# Patient Record
Sex: Female | Born: 1970
Health system: Southern US, Community
[De-identification: ages and names within clinical notes are randomized; demographics above are authoritative.]

## PROBLEM LIST (undated history)

## (undated) DIAGNOSIS — M8580 Other specified disorders of bone density and structure, unspecified site: Secondary | ICD-10-CM

## (undated) DIAGNOSIS — D229 Melanocytic nevi, unspecified: Secondary | ICD-10-CM

## (undated) DIAGNOSIS — C4492 Squamous cell carcinoma of skin, unspecified: Secondary | ICD-10-CM

## (undated) HISTORY — PX: BREAST SURGERY: SHX581

## (undated) HISTORY — DX: Other specified disorders of bone density and structure, unspecified site: M85.80

## (undated) HISTORY — PX: DILATION AND CURETTAGE OF UTERUS: SHX78

## (undated) HISTORY — PX: EXTERNAL AUDITORY CANAL RECONSTRUCTION: SHX428

## (undated) HISTORY — PX: WISDOM TOOTH EXTRACTION: SHX21

---
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
## 1997-09-14 ENCOUNTER — Ambulatory Visit (HOSPITAL_COMMUNITY): Admission: RE | Admit: 1997-09-14 | Discharge: 1997-09-14 | Payer: Self-pay | Admitting: Podiatry

## 1997-10-08 ENCOUNTER — Ambulatory Visit (HOSPITAL_COMMUNITY): Admission: RE | Admit: 1997-10-08 | Discharge: 1997-10-08 | Payer: Self-pay | Admitting: Podiatry

## 1997-11-11 ENCOUNTER — Other Ambulatory Visit: Admission: RE | Admit: 1997-11-11 | Discharge: 1997-11-11 | Payer: Self-pay | Admitting: Gynecology

## 1998-12-14 ENCOUNTER — Other Ambulatory Visit: Admission: RE | Admit: 1998-12-14 | Discharge: 1998-12-14 | Payer: Self-pay | Admitting: Gynecology

## 1999-12-06 ENCOUNTER — Other Ambulatory Visit: Admission: RE | Admit: 1999-12-06 | Discharge: 1999-12-06 | Payer: Self-pay | Admitting: Gynecology

## 2000-07-08 DIAGNOSIS — C4492 Squamous cell carcinoma of skin, unspecified: Secondary | ICD-10-CM

## 2000-07-08 HISTORY — DX: Squamous cell carcinoma of skin, unspecified: C44.92

## 2001-05-09 ENCOUNTER — Other Ambulatory Visit: Admission: RE | Admit: 2001-05-09 | Discharge: 2001-05-09 | Payer: Self-pay | Admitting: Obstetrics and Gynecology

## 2001-10-29 DIAGNOSIS — D229 Melanocytic nevi, unspecified: Secondary | ICD-10-CM

## 2001-10-29 HISTORY — DX: Melanocytic nevi, unspecified: D22.9

## 2002-02-19 ENCOUNTER — Ambulatory Visit (HOSPITAL_COMMUNITY): Admission: RE | Admit: 2002-02-19 | Discharge: 2002-02-19 | Payer: Self-pay | Admitting: Obstetrics and Gynecology

## 2002-05-08 ENCOUNTER — Other Ambulatory Visit: Admission: RE | Admit: 2002-05-08 | Discharge: 2002-05-08 | Payer: Self-pay | Admitting: Obstetrics and Gynecology

## 2003-05-10 ENCOUNTER — Other Ambulatory Visit: Admission: RE | Admit: 2003-05-10 | Discharge: 2003-05-10 | Payer: Self-pay | Admitting: Obstetrics and Gynecology

## 2004-01-21 ENCOUNTER — Ambulatory Visit (HOSPITAL_COMMUNITY): Admission: RE | Admit: 2004-01-21 | Discharge: 2004-01-21 | Payer: Self-pay | Admitting: Internal Medicine

## 2004-01-31 ENCOUNTER — Ambulatory Visit (HOSPITAL_COMMUNITY): Admission: RE | Admit: 2004-01-31 | Discharge: 2004-01-31 | Payer: Self-pay | Admitting: Internal Medicine

## 2004-05-10 ENCOUNTER — Other Ambulatory Visit: Admission: RE | Admit: 2004-05-10 | Discharge: 2004-05-10 | Payer: Self-pay | Admitting: Obstetrics and Gynecology

## 2005-06-06 ENCOUNTER — Other Ambulatory Visit: Admission: RE | Admit: 2005-06-06 | Discharge: 2005-06-06 | Payer: Self-pay | Admitting: Obstetrics and Gynecology

## 2011-11-28 ENCOUNTER — Other Ambulatory Visit: Payer: Self-pay | Admitting: Obstetrics and Gynecology

## 2011-11-28 DIAGNOSIS — Z1231 Encounter for screening mammogram for malignant neoplasm of breast: Secondary | ICD-10-CM

## 2011-12-06 ENCOUNTER — Ambulatory Visit: Payer: Self-pay

## 2013-05-25 ENCOUNTER — Ambulatory Visit (INDEPENDENT_AMBULATORY_CARE_PROVIDER_SITE_OTHER): Payer: BC Managed Care – PPO | Admitting: Podiatry

## 2013-05-25 ENCOUNTER — Encounter: Payer: Self-pay | Admitting: Podiatry

## 2013-05-25 ENCOUNTER — Telehealth: Payer: Self-pay | Admitting: *Deleted

## 2013-05-25 VITALS — BP 108/73 | HR 93 | Resp 20 | Ht 68.0 in | Wt 127.0 lb

## 2013-05-25 DIAGNOSIS — B351 Tinea unguium: Secondary | ICD-10-CM

## 2013-05-25 NOTE — Telephone Encounter (Signed)
Maria Hayes [phone] r 1st toenail fragment sent for diagnosis of fungal.

## 2013-05-25 NOTE — Progress Notes (Signed)
   Subjective:    Patient ID: Maria Hayes, female    DOB: [birth year], 43 y.o.   MRN: [id number]                       "I'm still having a problem with the nail it's not getting any better." HPI Comments:       N  Discolored, loose from skin, irritating L  Fungal Nail Hallux Rt D  4yrs O  Slowly  C  About the same A  None  T  OTC medicine   This patient presents again concern about the deformity in the distal aspect of the right hallux nail. A PAS and fungal culture was obtained on the initial visit of 04/25/2012. [facility] Accession [id number] demonstrated a negative PAS stain and a negative fungal culture final date 05/27/2012   Review of Systems  HENT: Positive for sore throat.   Respiratory: Positive for cough.   All other systems reviewed and are negative.        Objective:   Physical Exam Orientated x20 43 year old white female  Dermatological: The distal aspect of the right hallux nail is partially detached from the underlying nailbed with texture and color changes. The proximal nail plate is normal texture.       Assessment & Plan:   Assessment: Traumatic nail changes versus onychomycoses  Plan: Patient was advised that previous fungal culture was negative. I offered her repeat culture. She consents to this. The distal right hallux nail was debrided and the nail fragments submitted to [facility] lab for PAS stain and fungal culture. Notify patient on receipt of culture. Patient was advised that if the repeat culture was negative would assume that the nail pathology was not related to a fungal infection.

## 2013-05-28 ENCOUNTER — Other Ambulatory Visit (HOSPITAL_COMMUNITY): Payer: Self-pay | Admitting: Internal Medicine

## 2013-05-28 DIAGNOSIS — M810 Age-related osteoporosis without current pathological fracture: Secondary | ICD-10-CM

## 2013-06-02 ENCOUNTER — Other Ambulatory Visit: Payer: Self-pay | Admitting: Dermatology

## 2013-06-03 ENCOUNTER — Ambulatory Visit (HOSPITAL_COMMUNITY)
Admission: RE | Admit: 2013-06-03 | Discharge: 2013-06-03 | Disposition: A | Discharge status: Home / Self Care | Payer: BC Managed Care – PPO | Source: Ambulatory Visit | Attending: Internal Medicine | Admitting: Internal Medicine

## 2013-06-03 DIAGNOSIS — M810 Age-related osteoporosis without current pathological fracture: Secondary | ICD-10-CM

## 2013-06-03 DIAGNOSIS — Z13820 Encounter for screening for osteoporosis: Secondary | ICD-10-CM | POA: Insufficient documentation

## 2013-06-03 DIAGNOSIS — Z780 Asymptomatic menopausal state: Secondary | ICD-10-CM | POA: Insufficient documentation

## 2013-07-06 ENCOUNTER — Encounter: Payer: Self-pay | Admitting: Podiatry

## 2013-07-20 ENCOUNTER — Telehealth: Payer: Self-pay | Admitting: *Deleted

## 2013-07-20 NOTE — Telephone Encounter (Signed)
Pt request fungal culture results.

## 2013-07-21 ENCOUNTER — Encounter: Payer: Self-pay | Admitting: Podiatry

## 2013-07-22 NOTE — Telephone Encounter (Signed)
Left message [phone] that the fungal results may not be in for 4 to 6 weeks and callback would be after the doctor reviewed.

## 2013-07-24 ENCOUNTER — Telehealth: Payer: Self-pay | Admitting: *Deleted

## 2013-07-24 DIAGNOSIS — Z79899 Other long term (current) drug therapy: Secondary | ICD-10-CM

## 2013-07-24 NOTE — Telephone Encounter (Addendum)
Informed pt I called for the culture results and I would present to Dr Amalia Hailey on MOnday.  Dr Amalia Hailey states pt's fungal results show Sporanox is the medication of choice.  I informed pt of the therapy process and lab work.  Labs ordered electronically.  Mailed to pt's home - lab orders and map to lab.

## 2013-07-29 ENCOUNTER — Encounter: Payer: Self-pay | Admitting: Podiatry

## 2013-07-29 ENCOUNTER — Other Ambulatory Visit: Payer: Self-pay | Admitting: Podiatry

## 2013-08-05 LAB — CBC WITH DIFFERENTIAL/PLATELET
Basophils Absolute: 0 10*3/uL (ref 0.0–0.1)
Basophils Relative: 0 % (ref 0–1)
Eosinophils Absolute: 0.1 10*3/uL (ref 0.0–0.7)
Eosinophils Relative: 1 % (ref 0–5)
HCT: 40.3 % (ref 36.0–46.0)
Hemoglobin: 13.7 g/dL (ref 12.0–15.0)
Lymphocytes Relative: 22 % (ref 12–46)
Lymphs Abs: 1.3 10*3/uL (ref 0.7–4.0)
MCH: 30.3 pg (ref 26.0–34.0)
MCHC: 34 g/dL (ref 30.0–36.0)
MCV: 89.2 fL (ref 78.0–100.0)
Monocytes Absolute: 0.3 10*3/uL (ref 0.1–1.0)
Monocytes Relative: 6 % (ref 3–12)
Neutro Abs: 4.1 10*3/uL (ref 1.7–7.7)
Neutrophils Relative %: 71 % (ref 43–77)
Platelets: 154 10*3/uL (ref 150–400)
RBC: 4.52 MIL/uL (ref 3.87–5.11)
RDW: 13.1 % (ref 11.5–15.5)
WBC: 5.8 10*3/uL (ref 4.0–10.5)

## 2013-08-06 LAB — HEPATIC FUNCTION PANEL
ALT: 23 U/L (ref 0–35)
AST: 16 U/L (ref 0–37)
Albumin: 4.2 g/dL (ref 3.5–5.2)
Alkaline Phosphatase: 47 U/L (ref 39–117)
Bilirubin, Direct: 0.1 mg/dL (ref 0.0–0.3)
Indirect Bilirubin: 0.5 mg/dL (ref 0.2–1.2)
Total Bilirubin: 0.6 mg/dL (ref 0.2–1.2)
Total Protein: 6.5 g/dL (ref 6.0–8.3)

## 2013-08-12 ENCOUNTER — Other Ambulatory Visit: Payer: Self-pay | Admitting: *Deleted

## 2013-08-12 MED ORDER — ITRACONAZOLE 100 MG PO CAPS: ORAL_CAPSULE | ORAL | Status: DC

## 2013-08-12 NOTE — Telephone Encounter (Signed)
Per Dr. Amalia Hailey, I called and informed the patient that her blood work was normal.  We e-scribed a prescription for Sporanox to [facility].

## 2013-08-13 ENCOUNTER — Telehealth: Payer: Self-pay | Admitting: *Deleted

## 2013-08-13 NOTE — Telephone Encounter (Signed)
Message copied by Lolita Rieger on Thu Aug 13, 2013  9:48 AM ------      Message from: Harriett Sine D      Created: Wed Aug 12, 2013  5:15 PM       Atley Neubert, please call results.  Marcy Siren      ----- Message -----         From: Kendell Bane, DPM         Sent: 08/12/2013   8:30 AM           To: Andres Ege, RN            Lab is within normal limits.: Sporanox 200 mg by mouth twice a day x7 days. Off 3 weeks.       ------

## 2013-08-13 NOTE — Telephone Encounter (Signed)
I called to make sure patient had gotten her prescription.  She said she was going to pick it up today or tomorrow, plans on starting it over the weekend.  I informed her that her blood work was normal.

## 2013-08-27 ENCOUNTER — Telehealth: Payer: Self-pay | Admitting: *Deleted

## 2013-08-27 NOTE — Telephone Encounter (Signed)
I left a message last week because my pharmacy would not fill my prescription.  They said it requires prior authorization.  Calling to check on the status.  Please call me back.  I returned her call and informed her we are waiting on response from her insurance company.  We had to send them culture results and chart notes.  She asked how long it would take.  I told her I really can't say.  She asked that she be called once we find out if it has been approved or not.  I told her we will.

## 2013-09-03 ENCOUNTER — Telehealth: Payer: Self-pay | Admitting: *Deleted

## 2013-09-03 NOTE — Telephone Encounter (Signed)
I called to inform the patient that her insurance company denied coverage of her Itraconazole.  She asked for the reason.  I told her they said that the nail involvement does not causes significant debilitation or secondary infection despited appropriate conservative management.  She asked what can she do.  I told her she can appeal it.  She asked me to send her a copy of the denial.  I'm going to mail the information to her.

## 2013-11-19 ENCOUNTER — Other Ambulatory Visit: Payer: Self-pay | Admitting: Obstetrics and Gynecology

## 2013-11-19 DIAGNOSIS — N63 Unspecified lump in unspecified breast: Secondary | ICD-10-CM

## 2013-11-30 ENCOUNTER — Ambulatory Visit
Admission: RE | Admit: 2013-11-30 | Discharge: 2013-11-30 | Disposition: A | Discharge status: Home / Self Care | Payer: BC Managed Care – PPO | Source: Ambulatory Visit | Attending: Obstetrics and Gynecology | Admitting: Obstetrics and Gynecology

## 2013-11-30 DIAGNOSIS — N63 Unspecified lump in unspecified breast: Secondary | ICD-10-CM

## 2015-04-05 ENCOUNTER — Other Ambulatory Visit: Payer: Self-pay

## 2015-04-05 DIAGNOSIS — Z1231 Encounter for screening mammogram for malignant neoplasm of breast: Secondary | ICD-10-CM

## 2015-04-22 ENCOUNTER — Ambulatory Visit
Admission: RE | Admit: 2015-04-22 | Discharge: 2015-04-22 | Disposition: A | Discharge status: Home / Self Care | Payer: BLUE CROSS/BLUE SHIELD | Source: Ambulatory Visit

## 2015-04-22 DIAGNOSIS — Z1231 Encounter for screening mammogram for malignant neoplasm of breast: Secondary | ICD-10-CM

## 2015-06-21 ENCOUNTER — Other Ambulatory Visit: Payer: Self-pay | Admitting: Dermatology

## 2015-06-21 DIAGNOSIS — C439 Malignant melanoma of skin, unspecified: Secondary | ICD-10-CM

## 2015-06-21 HISTORY — DX: Malignant melanoma of skin, unspecified: C43.9

## 2015-07-08 ENCOUNTER — Other Ambulatory Visit: Payer: Self-pay | Admitting: Dermatology

## 2016-05-08 ENCOUNTER — Ambulatory Visit (INDEPENDENT_AMBULATORY_CARE_PROVIDER_SITE_OTHER): Payer: BLUE CROSS/BLUE SHIELD | Admitting: Physician Assistant

## 2016-05-08 ENCOUNTER — Ambulatory Visit (INDEPENDENT_AMBULATORY_CARE_PROVIDER_SITE_OTHER): Payer: BLUE CROSS/BLUE SHIELD

## 2016-05-08 VITALS — BP 102/66 | HR 84 | Temp 98.3°F | Resp 18 | Ht 68.0 in | Wt 126.0 lb

## 2016-05-08 DIAGNOSIS — J40 Bronchitis, not specified as acute or chronic: Secondary | ICD-10-CM

## 2016-05-08 DIAGNOSIS — R059 Cough, unspecified: Secondary | ICD-10-CM

## 2016-05-08 DIAGNOSIS — R05 Cough: Secondary | ICD-10-CM | POA: Diagnosis not present

## 2016-05-08 MED ORDER — PROMETHAZINE-DM 6.25-15 MG/5ML PO SYRP: 2.5000 mL | ORAL_SOLUTION | Freq: Four times a day (QID) | ORAL | 0 refills | Status: DC | PRN

## 2016-05-08 MED ORDER — DOXYCYCLINE HYCLATE 100 MG PO TABS: 100.0000 mg | ORAL_TABLET | Freq: Two times a day (BID) | ORAL | 0 refills | Status: DC

## 2016-05-08 MED FILL — PROMETHAZINE-DM SYRUP: 6.25-15 | 6 days supply | Qty: 118 | Fill #0

## 2016-05-08 MED FILL — DOXYCYCLINE HYCLATE 100 MG: 100 | 10 days supply | Qty: 20 | Fill #0

## 2016-05-08 NOTE — Patient Instructions (Addendum)
  Please push fluids.  Tylenol and Motrin for fever and body aches.    A humidifier can help especially when the air is dry -if you do not have a humidifier you can boil a pot of water on the stove in your home to help with the dry air.  Nasal saline spray can be helpful to keep the mucus membranes moist and thin the nasal mucus    IF you received an x-ray today, you will receive an invoice from [facility]. Please contact [facility] at [phone] with questions or concerns regarding your invoice.   IF you received labwork today, you will receive an invoice from [facility]. Please contact [facility] at [phone] with questions or concerns regarding your invoice.   Our billing staff will not be able to assist you with questions regarding bills from these companies.  You will be contacted with the lab results as soon as they are available. The fastest way to get your results is to activate your My Chart account. Instructions are located on the last page of this paperwork. If you have not heard from us regarding the results in 2 weeks, please contact this office.      

## 2016-05-08 NOTE — Progress Notes (Signed)
Maria Hayes  MRN: [id number] DOB: 06/04/1970  Subjective:  Pt presents to clinic with 2 week h/o cold symptoms.  Started with congestion and then last week she started having a cough which she thought was improving until this am when she sneezing she started having more chest tenderness on the right side - the pain is not reproducible but she does have more pain with movement and slight pain with deep breathing.  She has been using Vicks vapor rub on her feet and that has helped her cough but it is worse at night.  The cough was productive last week with green thick sputum, this week it is minimal sputum production. She is having no SOB or wheezing.  Hycodan - made her nauseated so she stopped that mucinex - DM gave her a HA  Review of Systems  Constitutional: Negative for chills and fever.  HENT: Negative for congestion, sinus pressure and sore throat.   Respiratory: Positive for cough (green) and chest tightness. Negative for shortness of breath and wheezing.        No h/o asthma, nonsmoker  Gastrointestinal: Negative.   Neurological: Positive for headaches.  Psychiatric/Behavioral: Negative for sleep disturbance.    There are no active problems to display for this patient.   No current outpatient prescriptions on file prior to visit.   No current facility-administered medications on file prior to visit.     No Known Allergies  Pt patients past, family and social history were reviewed and updated.   Objective:  BP 102/66 (BP Location: Right Arm, Patient Position: Sitting, Cuff Size: Small)   Pulse 84   Temp 98.3 F (36.8 C) (Oral)   Resp 18   Ht 5\' 8"  (1.727 m)   Wt 126 lb (57.2 kg)   LMP  (LMP Unknown)   SpO2 100%   BMI 19.16 kg/m   Physical Exam  Constitutional: She is oriented to person, place, and time and well-developed, well-nourished, and in no distress.  HENT:  Head: Normocephalic and atraumatic.  Right Ear: Hearing, tympanic membrane, external ear  and ear canal normal.  Left Ear: Hearing, tympanic membrane, external ear and ear canal normal.  Nose: Nose normal.  Mouth/Throat: Uvula is midline, oropharynx is clear and moist and mucous membranes are normal.  Eyes: Conjunctivae are normal.  Neck: Normal range of motion.  Cardiovascular: Normal rate, regular rhythm and normal heart sounds.   No murmur heard. Pulmonary/Chest: Effort normal and breath sounds normal. She has no wheezes.  Expiratory phase is longer than inspiratory phase  Neurological: She is alert and oriented to person, place, and time. Gait normal.  Skin: Skin is warm and dry.  Psychiatric: Mood, memory, affect and judgment normal.  Vitals reviewed.  Dg Chest 2 View  Result Date: 05/08/2016 CLINICAL DATA:  Cough and right-sided chest pain for the past 2 days. Nonsmoker. EXAM: CHEST  2 VIEW COMPARISON:  None in PACs FINDINGS: The lungs are mildly hyperinflated. There is no focal infiltrate. There is no pneumothorax, pneumomediastinum, or pleural effusion. The heart and pulmonary vascularity are normal. The mediastinum is normal in width. There is no pleural effusion. IMPRESSION: Hyperinflation which may be voluntary or may reflect reactive airway disease/acute bronchitis. There is no pneumonia nor other acute cardiopulmonary abnormality. Electronically Signed   By: David  Martinique M.D.   On: 05/08/2016 11:36    Assessment and Plan :  Cough - Plan: DG Chest 2 View  Bronchitis - Plan: doxycycline (VIBRA-TABS) 100 MG tablet, promethazine-dextromethorphan (  PROMETHAZINE-DM) 6.25-15 MG/5ML syrup - treat for atypical bacteria - cough medication as needed - there is no wheezing on exam but she will let me know if she starts and I will send her in an albuterol inhaler at that time.  Increase fluids with help get mucus thin since mucinex gave her a HA.  Windell Hummingbird PA-C  [facility] 05/08/2016 12:21 PM

## 2016-05-09 ENCOUNTER — Telehealth: Payer: Self-pay

## 2016-05-09 NOTE — Telephone Encounter (Signed)
Pt was seen 12/19 by Maria Hayes she was given antibiotic and she now has a fever and she didn't know if that needed to be addressed and her work will also require a note for when she goes back to work   She would need it for the rest of the week since she has a fever she said it could be faxed to her office [phone]

## 2016-05-10 NOTE — Telephone Encounter (Signed)
Pt saw her pcp today and got all this addressed, she is now on tamiflu

## 2016-05-18 ENCOUNTER — Ambulatory Visit (HOSPITAL_COMMUNITY)
Admission: RE | Admit: 2016-05-18 | Discharge: 2016-05-18 | Disposition: A | Discharge status: Home / Self Care | Payer: BLUE CROSS/BLUE SHIELD | Source: Ambulatory Visit | Attending: Internal Medicine | Admitting: Internal Medicine

## 2016-05-18 ENCOUNTER — Other Ambulatory Visit (HOSPITAL_COMMUNITY): Payer: Self-pay | Admitting: Internal Medicine

## 2016-05-18 DIAGNOSIS — R079 Chest pain, unspecified: Secondary | ICD-10-CM

## 2016-05-18 DIAGNOSIS — R05 Cough: Secondary | ICD-10-CM | POA: Insufficient documentation

## 2016-05-18 DIAGNOSIS — R059 Cough, unspecified: Secondary | ICD-10-CM

## 2016-06-12 ENCOUNTER — Encounter (HOSPITAL_COMMUNITY): Payer: BLUE CROSS/BLUE SHIELD | Attending: Hematology & Oncology | Admitting: Hematology & Oncology

## 2016-06-12 ENCOUNTER — Encounter (HOSPITAL_COMMUNITY): Payer: Self-pay | Admitting: Hematology & Oncology

## 2016-06-12 VITALS — BP 113/71 | HR 75 | Temp 98.1°F | Resp 16 | Ht 68.0 in | Wt 126.2 lb

## 2016-06-12 DIAGNOSIS — Z803 Family history of malignant neoplasm of breast: Secondary | ICD-10-CM | POA: Diagnosis not present

## 2016-06-12 DIAGNOSIS — Z801 Family history of malignant neoplasm of trachea, bronchus and lung: Secondary | ICD-10-CM | POA: Insufficient documentation

## 2016-06-12 DIAGNOSIS — D0372 Melanoma in situ of left lower limb, including hip: Secondary | ICD-10-CM

## 2016-06-12 DIAGNOSIS — Z9889 Other specified postprocedural states: Secondary | ICD-10-CM | POA: Insufficient documentation

## 2016-06-12 DIAGNOSIS — D72819 Decreased white blood cell count, unspecified: Secondary | ICD-10-CM

## 2016-06-12 DIAGNOSIS — D696 Thrombocytopenia, unspecified: Secondary | ICD-10-CM | POA: Diagnosis not present

## 2016-06-12 LAB — SEDIMENTATION RATE: Sed Rate: 4 mm/hr (ref 0–22)

## 2016-06-12 LAB — CBC WITH DIFFERENTIAL/PLATELET
Basophils Absolute: 0 10*3/uL (ref 0.0–0.1)
Basophils Relative: 1 %
Eosinophils Absolute: 0.1 10*3/uL (ref 0.0–0.7)
Eosinophils Relative: 2 %
HCT: 39.6 % (ref 36.0–46.0)
Hemoglobin: 13.6 g/dL (ref 12.0–15.0)
Lymphocytes Relative: 34 %
Lymphs Abs: 1.1 10*3/uL (ref 0.7–4.0)
MCH: 30.5 pg (ref 26.0–34.0)
MCHC: 34.3 g/dL (ref 30.0–36.0)
MCV: 88.8 fL (ref 78.0–100.0)
Monocytes Absolute: 0.3 10*3/uL (ref 0.1–1.0)
Monocytes Relative: 10 %
Neutro Abs: 1.7 10*3/uL (ref 1.7–7.7)
Neutrophils Relative %: 53 %
Platelets: 138 10*3/uL — ABNORMAL LOW (ref 150–400)
RBC: 4.46 MIL/uL (ref 3.87–5.11)
RDW: 12.9 % (ref 11.5–15.5)
WBC: 3.3 10*3/uL — ABNORMAL LOW (ref 4.0–10.5)

## 2016-06-12 LAB — COMPREHENSIVE METABOLIC PANEL
ALT: 24 U/L (ref 14–54)
AST: 24 U/L (ref 15–41)
Albumin: 5.1 g/dL — ABNORMAL HIGH (ref 3.5–5.0)
Alkaline Phosphatase: 71 U/L (ref 38–126)
Anion gap: 7 (ref 5–15)
BUN: 14 mg/dL (ref 6–20)
CO2: 30 mmol/L (ref 22–32)
Calcium: 10.2 mg/dL (ref 8.9–10.3)
Chloride: 99 mmol/L — ABNORMAL LOW (ref 101–111)
Creatinine, Ser: 0.9 mg/dL (ref 0.44–1.00)
GFR calc Af Amer: >60 mL/min (ref >60)
GFR calc non Af Amer: >60 mL/min (ref >60)
Glucose, Bld: 83 mg/dL (ref 65–99)
Potassium: 3.7 mmol/L (ref 3.5–5.1)
Sodium: 136 mmol/L (ref 135–145)
Total Bilirubin: 1.1 mg/dL (ref 0.3–1.2)
Total Protein: 8.2 g/dL — ABNORMAL HIGH (ref 6.5–8.1)

## 2016-06-12 LAB — FOLATE: Folate: 44.1 ng/mL (ref >5.9)

## 2016-06-12 LAB — FERRITIN: Ferritin: 170 ng/mL (ref 11–307)

## 2016-06-12 LAB — C-REACTIVE PROTEIN: CRP: <0.8 mg/dL (ref <1.0)

## 2016-06-12 LAB — VITAMIN B12: Vitamin B-12: 853 pg/mL (ref 180–914)

## 2016-06-12 NOTE — Progress Notes (Signed)
[facility] NOTE  Patient Care Team: Asencion Noble, MD as PCP - General (Internal Medicine)  CHIEF COMPLAINTS/PURPOSE OF CONSULTATION:  Leukopenia Thrombocytopenia  HISTORY OF PRESENTING ILLNESS:  Maria Hayes 46 y.o. female is here because of referral by Dr. Willey Blade for leukopenia and thrombocytopenia.  Her lab results from 04/07/16 showed low wbc at 2.4 x 10E3 / uL and low platelet count of 133,000 / uL.   I personally reviewed and went over labs with the patient.  Energy is good. She states she tries to exercise in the building in her office.   Denies any infections.  She has been seeing Dr. Willey Blade for about 19 years.  In December she got the flu and bronchitis. She had the flu 20th of December Notes it was the first time she has been sick in about 10 years. She has never received a flu shot.  She had a recent urinary tract infection but says it doesn't happen often. She attributes it to not drinking a lot of water.    She notes that she has had low blood count since her first physical with Dr. Willey Blade and she has been getting it monitored every year since.   She went to the chiropractor about her bad left shoulder and they told her she had a frozen shoulder. She has seen an acupuncturist for her shoulder about 3 times now. She notes decent range of motion of the shoulder. Denies problems getting up from a chair.  Denies joint, leg, and knee swelling. Notes mother has osteoarthritis.   Denies abdominal pain or change in appetite. No fever, chills. No rash. No nose bleeding, gum bleeding or unusual bruising.   She states she is due for a mammogram and that she gets one every year.   MEDICAL HISTORY:  History reviewed. No pertinent past medical history.  SURGICAL HISTORY: Past Surgical History:  Procedure Laterality Date  . DILATION AND CURETTAGE OF UTERUS    . EXTERNAL AUDITORY CANAL RECONSTRUCTION Bilateral   . WISDOM TOOTH EXTRACTION       SOCIAL HISTORY: Social History   Social History  . Marital status: Married    Spouse name: N/A  . Number of children: N/A  . Years of education: N/A   Occupational History  . Not on file.   Social History Main Topics  . Smoking status: Never Smoker  . Smokeless tobacco: Never Used  . Alcohol use No  . Drug use: No  . Sexual activity: Not on file   Other Topics Concern  . Not on file   Social History Narrative  . No narrative on file   Social Hx: Married - 24 years Works for [employer]- almost 20 years Non-smoker No problems with alcohol No pets currently- used to have a Systems developer- just hanging out, not a lot of time for hobbies Mom - living Dad- deceased 1 sister  FAMILY HISTORY: Family History  Problem Relation Age of Onset  . Cancer Father    Family Hx:  Mom- living 85 - relatively healthy, had breast cancer in remission Dad- deceased 73 years ago - smoker- had lung cancer 1 sister - relatively healthy - thyroid issues   ALLERGIES:  has No Known Allergies.  MEDICATIONS:  No current outpatient prescriptions on file.   No current facility-administered medications for this visit.     Review of Systems  Constitutional: Negative.  Negative for malaise/fatigue.       No change in  appetite, appetite is good  HENT: Negative.   Eyes: Negative.   Respiratory: Negative.   Cardiovascular: Negative.  Negative for leg swelling.       Denies knee swelling  Gastrointestinal: Negative.  Negative for abdominal pain.  Genitourinary: Negative.   Musculoskeletal: Negative.        Denies joint swelling  Skin: Negative.   Neurological: Negative.   Endo/Heme/Allergies: Negative.   Psychiatric/Behavioral: Negative.   All other systems reviewed and are negative.  14 point ROS was done and is otherwise as detailed above or in HPI   PHYSICAL EXAMINATION: ECOG PERFORMANCE STATUS: 0 - Asymptomatic  Vitals:   06/12/16 1537  BP: 113/71  Pulse: 75   Resp: 16  Temp: 98.1 F (36.7 C)   Filed Weights   06/12/16 1537  Weight: 126 lb 3.2 oz (57.2 kg)    Physical Exam  Constitutional: She is oriented to person, place, and time and well-developed, well-nourished, and in no distress.  HENT:  Head: Normocephalic and atraumatic.  Nose: Nose normal.  Mouth/Throat: Oropharynx is clear and moist. No oropharyngeal exudate.  Eyes: Conjunctivae and EOM are normal. Pupils are equal, round, and reactive to light. Right eye exhibits no discharge. Left eye exhibits no discharge. No scleral icterus.  Neck: Normal range of motion. Neck supple. No tracheal deviation present. No thyromegaly present.  Cardiovascular: Normal rate, regular rhythm and normal heart sounds.  Exam reveals no gallop and no friction rub.   No murmur heard. Pulmonary/Chest: Effort normal and breath sounds normal. She has no wheezes. She has no rales.  Abdominal: Soft. Bowel sounds are normal. She exhibits no distension and no mass. There is no tenderness. There is no rebound and no guarding.  Musculoskeletal: Normal range of motion. She exhibits no edema.  Lymphadenopathy:    She has no cervical adenopathy.  Neurological: She is alert and oriented to person, place, and time. She has normal reflexes. No cranial nerve deficit. Gait normal. Coordination normal.  Skin: Skin is warm and dry. No rash noted.  Psychiatric: Mood, memory, affect and judgment normal.  Nursing note and vitals reviewed.   LABORATORY DATA:  I have reviewed the data as listed Lab Results  Component Value Date   WBC 5.8 08/05/2013   HGB 13.7 08/05/2013   HCT 40.3 08/05/2013   MCV 89.2 08/05/2013   PLT 154 08/05/2013   CMP     Component Value Date/Time   PROT 6.5 08/05/2013 1050   ALBUMIN 4.2 08/05/2013 1050   AST 16 08/05/2013 1050   ALT 23 08/05/2013 1050   ALKPHOS 47 08/05/2013 1050   BILITOT 0.6 08/05/2013 1050           RADIOGRAPHIC STUDIES: I have personally reviewed the  radiological images as listed and agreed with the findings in the report. No results found.  Study Result   CLINICAL DATA:  Productive cough, chest and back pain  EXAM: CHEST  2 VIEW  COMPARISON:  Chest x-ray of 05/08/2016  FINDINGS: The lungs are clear but slightly hyperaerated. Mediastinal and hilar contours are unremarkable. The heart is within normal limits in size. No bony abnormality is seen.  IMPRESSION: Slight hyper aeration.  No active lung disease.   Electronically Signed   By: Ivar Drape M.D.   On: 05/18/2016 11:54    PATHOLOGY:    ASSESSMENT & PLAN: Intermittent thrombocytopenia Leukopenia, intermittent Melanoma in situ   I discussed with the patient that thrombocytopenia can be associated with a variety of conditions.  Given that her this was an incidental finding it is most likely not "life-threatening" and secondary to immune mediated causes or potential medication exposure.  Does not have a known history of occult liver disease. Her other blood counts do not lead [place] to suspect an MDS. New onset thrombocytopenia rules out the likelihood of congenital thrombocytopenia. She does not give a history that would suggest HIV or HCV infection although it is felt that patients should be tested with a new onset thrombocytopenia.  We will confirm thrombocytopenia by repeating the CBC and reviewing the peripheral blood smear; obtain prior platelet counts, if available, and assess other hematologic abnormalities.  The combination of leukopenia and thrombocytopenia could be indicative of underlying autoimmune dz although she has no other symptoms. Will proceed with basic labs. Note that differentials are not available on most of her CBC's.    In a patient with neutropenia, the CBC should be evaluated for other cytopenias (eg, anemia, thrombocytopenia). Neutropenia accompanied by anemia and/or thrombocytopenia suggests a nutritional deficiency (eg, vitamin B12,  folate, or copper), a primary bone marrow disorder, or an autoimmune disorder associated with autoimmune destruction of multiple cell lines.  Neutropenia with coexisting anemia could also suggest a chronic inflammatory state, due to a number of chronic medical conditions.  Neutropenia with associated lymphopenia suggests the possibility of an immunodeficiency state.  For incidentally discovered neutropenia, nutritional deficiency and collagen/vascular disease are more common (the latter especially in females). We screen for nutritional deficiency by measuring vitamin B12, folate, and copper levels. Screening for viral hepatitis and HIV infection may also be helpful in individuals who have not had liver function testing or a recent HIV test. Screening for collagen/vascular disease using measurement of anti-nuclear antibody (ANA), anti-DNA antibody, complement levels (C3, C4), and urine analysis including protein/creatinine ratio is often performed, but this testing is of limited utility in the absence of other signs or symptoms of collagen-vascular disease. In the presence of such symptoms, or with concomitant thrombocytopenia, evaluation for systemic autoimmune disease is warranted.  Orders Placed This Encounter  Procedures  . Copper, serum  . Systemic Lupus Profile A  . Pathologist smear review  . CBC with Differential  . Comprehensive metabolic panel  . Sedimentation rate  . Vitamin B12  . Folate  . C-reactive protein  . C3 and C4  . ANA+ENA+DNA/DS+Scl 70+SjoSSA/B  . Ferritin    I told her to call our clinic if she has any questions before her next follow up.  She will follow up in 2 weeks.    This document serves as a record of services personally performed by Ancil Linsey, MD. It was created on her behalf by Shirlean Mylar, a trained medical scribe. The creation of this record is based on the scribe's personal observations and the provider's statements to them. This document has  been checked and approved by the attending provider.  I have reviewed the above documentation for accuracy and completeness and I agree with the above.  Kelby Fam. Keaja Reaume, M.D.  This note was electronically signed.   06/12/2016 4:25 PM

## 2016-06-12 NOTE — Patient Instructions (Signed)
[place] at [facility] Discharge Instructions  RECOMMENDATIONS MADE BY THE CONSULTANT AND ANY TEST RESULTS WILL BE SENT TO YOUR REFERRING PHYSICIAN.  You were seen today by Dr. Youlanda Roys will have lab work drawn today and we will call you with the results Follow up in 2-3 weeks  Thank you for choosing [place] at [facility] to provide your oncology and hematology care.  To afford each patient quality time with our provider, please arrive at least 15 minutes before your scheduled appointment time.    If you have a lab appointment with the [facility] please come in thru the  Main Entrance and check in at the main information desk  You need to re-schedule your appointment should you arrive 10 or more minutes late.  We strive to give you quality time with our providers, and arriving late affects you and other patients whose appointments are after yours.  Also, if you no show three or more times for appointments you may be dismissed from the clinic at the providers discretion.     Again, thank you for choosing [facility].  Our hope is that these requests will decrease the amount of time that you wait before being seen by our physicians.       _____________________________________________________________  Should you have questions after your visit to [facility], please contact our office at [phone] between the hours of 8:30 a.m. and 4:30 p.m.  Voicemails left after 4:30 p.m. will not be returned until the following business day.  For prescription refill requests, have your pharmacy contact our office.       Resources For Cancer Patients and their Caregivers ? American Cancer Society: Can assist with transportation, wigs, general needs, runs Look Good Feel Better.        540-517-1670 ? Cancer Care: Provides financial assistance, online support groups, medication/co-pay assistance.  1-800-813-HOPE  8654426832) ? [place] Assists [place] cancer patients and their families through emotional , educational and financial support.  (540)803-6021 ? [place] DSS Where to apply for food stamps, Medicaid and utility assistance. 330-479-1251 ? [facility]: Transportation to medical appointments. (930) 783-3245 ? Social Security Administration: May apply for disability if have a Stage IV cancer. 607-311-2754 (551)625-9769 ? [employer], Disability and Transit Services: Assists with nutrition, care and transit needs. [place] Support Programs: @10RELATIVEDAYS @ > Cancer Support Group  2nd Tuesday of the month 1pm-2pm, Journey Room  > Creative Journey  3rd Tuesday of the month 1130am-1pm, Journey Room  > Look Good Feel Better  1st Wednesday of the month 10am-12 noon, Journey Room (Call [place] to register [phone])

## 2016-06-14 LAB — PATHOLOGIST SMEAR REVIEW

## 2016-06-14 LAB — COPPER, SERUM: Copper: 100 ug/dL (ref 72–166)

## 2016-06-20 ENCOUNTER — Encounter (HOSPITAL_COMMUNITY): Payer: Self-pay | Admitting: Hematology & Oncology

## 2016-06-29 ENCOUNTER — Ambulatory Visit (HOSPITAL_COMMUNITY): Payer: BLUE CROSS/BLUE SHIELD | Admitting: Oncology

## 2016-07-02 ENCOUNTER — Encounter (HOSPITAL_COMMUNITY): Payer: Self-pay

## 2016-07-02 ENCOUNTER — Telehealth: Payer: Self-pay

## 2016-07-02 ENCOUNTER — Other Ambulatory Visit (HOSPITAL_COMMUNITY): Payer: Self-pay | Admitting: *Deleted

## 2016-07-02 ENCOUNTER — Encounter (HOSPITAL_COMMUNITY): Payer: BLUE CROSS/BLUE SHIELD

## 2016-07-02 ENCOUNTER — Encounter (HOSPITAL_COMMUNITY): Payer: BLUE CROSS/BLUE SHIELD | Attending: Hematology & Oncology | Admitting: Oncology

## 2016-07-02 VITALS — BP 119/62 | HR 92 | Temp 97.6°F | Resp 16 | Wt 124.6 lb

## 2016-07-02 DIAGNOSIS — D696 Thrombocytopenia, unspecified: Secondary | ICD-10-CM

## 2016-07-02 DIAGNOSIS — Z803 Family history of malignant neoplasm of breast: Secondary | ICD-10-CM | POA: Insufficient documentation

## 2016-07-02 DIAGNOSIS — D72819 Decreased white blood cell count, unspecified: Secondary | ICD-10-CM | POA: Insufficient documentation

## 2016-07-02 DIAGNOSIS — D0372 Melanoma in situ of left lower limb, including hip: Secondary | ICD-10-CM

## 2016-07-02 DIAGNOSIS — D708 Other neutropenia: Secondary | ICD-10-CM

## 2016-07-02 DIAGNOSIS — D709 Neutropenia, unspecified: Secondary | ICD-10-CM

## 2016-07-02 DIAGNOSIS — Z801 Family history of malignant neoplasm of trachea, bronchus and lung: Secondary | ICD-10-CM | POA: Insufficient documentation

## 2016-07-02 DIAGNOSIS — Z9889 Other specified postprocedural states: Secondary | ICD-10-CM | POA: Insufficient documentation

## 2016-07-02 NOTE — Patient Instructions (Signed)
[place] at [facility] Discharge Instructions  RECOMMENDATIONS MADE BY THE CONSULTANT AND ANY TEST RESULTS WILL BE SENT TO YOUR REFERRING PHYSICIAN.  You were seen today by Dr. Barron Schmid Have lab work redrawn today, we will call you with the results Follow up in 6 months with lab work See Amy up front for appointments   Thank you for choosing [place] at [facility] to provide your oncology and hematology care.  To afford each patient quality time with our provider, please arrive at least 15 minutes before your scheduled appointment time.    If you have a lab appointment with the [place] [facility] please come in thru the  Main Entrance and check in at the main information desk  You need to re-schedule your appointment should you arrive 10 or more minutes late.  We strive to give you quality time with our providers, and arriving late affects you and other patients whose appointments are after yours.  Also, if you no show three or more times for appointments you may be dismissed from the clinic at the providers discretion.     Again, thank you for choosing [facility].  Our hope is that these requests will decrease the amount of time that you wait before being seen by our physicians.       _____________________________________________________________  Should you have questions after your visit to [facility], please contact our office at [phone] between the hours of 8:30 a.m. and 4:30 p.m.  Voicemails left after 4:30 p.m. will not be returned until the following business day.  For prescription refill requests, have your pharmacy contact our office.       Resources For Cancer Patients and their Caregivers ? American Cancer Society: Can assist with transportation, wigs, general needs, runs Look Good Feel Better.        (405)657-0038 ? Cancer Care: Provides financial assistance, online support groups,  medication/co-pay assistance.  1-800-813-HOPE (270) 231-4397) ? [place] Assists [place] cancer patients and their families through emotional , educational and financial support.  425-465-3315 ? [place] DSS Where to apply for food stamps, Medicaid and utility assistance. (832) 164-4803 ? [facility]: Transportation to medical appointments. 785 628 3039 ? Social Security Administration: May apply for disability if have a Stage IV cancer. 269-198-5961 585-293-8068 ? [employer], Disability and Transit Services: Assists with nutrition, care and transit needs. [place] Support Programs: @10RELATIVEDAYS @ > Cancer Support Group  2nd Tuesday of the month 1pm-2pm, Journey Room  > Creative Journey  3rd Tuesday of the month 1130am-1pm, Journey Room  > Look Good Feel Better  1st Wednesday of the month 10am-12 noon, Journey Room (Call [place] to register [phone])

## 2016-07-02 NOTE — Progress Notes (Signed)
[place]  PROGRESS NOTE  Patient Care Team: Asencion Noble, MD as PCP - General (Internal Medicine)  CHIEF COMPLAINTS/PURPOSE OF CONSULTATION:  Leukopenia Thrombocytopenia  HISTORY OF PRESENTING ILLNESS:  Maria Hayes 46 y.o. female is here for follow up of leukopenia and thrombocytopenia.  She has been experiencing low WBC counts for about 11 years. She has developed a L frozen shoulder about 8 months ago and some other inflammatory problems that caused her to seek help from a hematologist rather than just following with her PCP. Denies chest pain, SOB, sinus infections, or any other concerns. Labwork performed on 06/12/16 demonstrated a WBC of 3.3k, platelet count 138k, differential is normal with an ANC of 1.7k. Folate, b12, CRP, copper has all been WNL. Her autoimmune panel that was drawn on her last visit is not available.  MEDICAL HISTORY:  No past medical history on file.  SURGICAL HISTORY: Past Surgical History:  Procedure Laterality Date  . DILATION AND CURETTAGE OF UTERUS    . EXTERNAL AUDITORY CANAL RECONSTRUCTION Bilateral   . WISDOM TOOTH EXTRACTION      SOCIAL HISTORY: Social History   Social History  . Marital status: Married    Spouse name: N/A  . Number of children: N/A  . Years of education: N/A   Occupational History  . Not on file.   Social History Main Topics  . Smoking status: Never Smoker  . Smokeless tobacco: Never Used  . Alcohol use No  . Drug use: No  . Sexual activity: Not on file   Other Topics Concern  . Not on file   Social History Narrative  . No narrative on file   Social Hx: Married - 24 years Works for [employer]- almost 20 years Non-smoker No problems with alcohol No pets currently- used to have a Systems developer- just hanging out, not a lot of time for hobbies Mom - living Dad- deceased 1 sister  FAMILY HISTORY: Family History  Problem Relation Age of Onset  . Cancer Father    Family Hx:  Mom-  living 44 - relatively healthy, had breast cancer in remission Dad- deceased 45 years ago - smoker- had lung cancer 1 sister - relatively healthy - thyroid issues   ALLERGIES:  has No Known Allergies.  MEDICATIONS:  No current outpatient prescriptions on file.   No current facility-administered medications for this visit.     Review of Systems  Constitutional: Negative.   HENT: Negative.   Eyes: Negative.   Respiratory: Negative.  Negative for shortness of breath.   Cardiovascular: Negative.  Negative for chest pain.  Gastrointestinal: Negative.   Genitourinary: Negative.   Musculoskeletal:       Frozen shoulder (L)  Skin: Negative.   Neurological: Negative.   Endo/Heme/Allergies: Negative.   Psychiatric/Behavioral: Negative.   All other systems reviewed and are negative. 14 point ROS was done and is otherwise as detailed above or in HPI  PHYSICAL EXAMINATION: ECOG PERFORMANCE STATUS: 0 - Asymptomatic  Vitals:   07/02/16 0858  BP: 119/62  Pulse: 92  Resp: 16  Temp: 97.6 F (36.4 C)   Filed Weights   07/02/16 0858  Weight: 124 lb 9.6 oz (56.5 kg)   Physical Exam  Constitutional: She is oriented to person, place, and time and well-developed, well-nourished, and in no distress.  HENT:  Head: Normocephalic and atraumatic.  Nose: Nose normal.  Mouth/Throat: Oropharynx is clear and moist. No oropharyngeal exudate.  Eyes: Conjunctivae and EOM are normal.  Pupils are equal, round, and reactive to light. Right eye exhibits no discharge. Left eye exhibits no discharge. No scleral icterus.  Neck: Normal range of motion. Neck supple. No tracheal deviation present. No thyromegaly present.  Cardiovascular: Normal rate, regular rhythm and normal heart sounds.  Exam reveals no gallop and no friction rub.   No murmur heard. Pulmonary/Chest: Effort normal and breath sounds normal. She has no wheezes. She has no rales.  Abdominal: Soft. Bowel sounds are normal. She exhibits no  distension and no mass. There is no tenderness. There is no rebound and no guarding.  Musculoskeletal: Normal range of motion. She exhibits no edema.  Lymphadenopathy:    She has no cervical adenopathy.  Neurological: She is alert and oriented to person, place, and time. She has normal reflexes. No cranial nerve deficit. Gait normal. Coordination normal.  Skin: Skin is warm and dry. No rash noted.  Psychiatric: Mood, memory, affect and judgment normal.  Nursing note and vitals reviewed.   LABORATORY DATA:  I have reviewed the data as listed Lab Results  Component Value Date   WBC 3.3 (L) 06/12/2016   HGB 13.6 06/12/2016   HCT 39.6 06/12/2016   MCV 88.8 06/12/2016   PLT 138 (L) 06/12/2016   CMP     Component Value Date/Time   NA 136 06/12/2016 1626   K 3.7 06/12/2016 1626   CL 99 (L) 06/12/2016 1626   CO2 30 06/12/2016 1626   GLUCOSE 83 06/12/2016 1626   BUN 14 06/12/2016 1626   CREATININE 0.90 06/12/2016 1626   CALCIUM 10.2 06/12/2016 1626   PROT 8.2 (H) 06/12/2016 1626   ALBUMIN 5.1 (H) 06/12/2016 1626   AST 24 06/12/2016 1626   ALT 24 06/12/2016 1626   ALKPHOS 71 06/12/2016 1626   BILITOT 1.1 06/12/2016 1626   GFRNONAA >60 06/12/2016 1626   GFRAA >60 06/12/2016 1626           RADIOGRAPHIC STUDIES: I have personally reviewed the radiological images as listed and agreed with the findings in the report. No results found.  05/18/2016 IMPRESSION: Slight hyper aeration.  No active lung disease.   PATHOLOGY:    ASSESSMENT & PLAN: Intermittent thrombocytopenia Leukopenia, intermittent Melanoma in situ   Reviewed available labs in detail with the patient today. Given that the patient has had mild leukopenia for over a decade, I suspect this is likely a benign leukopenia. She is unlikely to be affected by this leukopenia especially given her normal differential.  Repeat autoimmune work up since it was collected, and we called the lab but they cannot find the  labs. Order SPEP and IFE to test for multiple myeloma, because of elevated total protein levels, however low suspicion for multiple myeloma. Patient has difficulty taking off work so I will call her with the results of her labs. If positive for autoimmune disorder, I will refer to a Rheumatologist.   She will return for follow up in 6 months with repeat CBC..   This document serves as a record of services personally performed by Twana First, MD. It was created on her behalf by Martinique Casey, a trained medical scribe. The creation of this record is based on the scribe's personal observations and the provider's statements to them. This document has been checked and approved by the attending provider.  I have reviewed the above documentation for accuracy and completeness and I agree with the above.  This note was electronically signed.   07/02/2016 9:04 AM

## 2016-07-02 NOTE — Telephone Encounter (Signed)
[facility] is asking if we can draw labs at [facility] for Dr Talbert Cage. Pt sees Dr Talbert Cage but lives in [place].  First thing in the morning any day this week. In basket sent.

## 2016-07-03 ENCOUNTER — Ambulatory Visit: Payer: BLUE CROSS/BLUE SHIELD

## 2016-07-03 DIAGNOSIS — D709 Neutropenia, unspecified: Secondary | ICD-10-CM

## 2016-07-03 DIAGNOSIS — D696 Thrombocytopenia, unspecified: Secondary | ICD-10-CM

## 2016-07-04 LAB — SYSTEMIC LUPUS PROFILE A
Chromatin Ab SerPl-aCnc: <0.2 AI (ref 0.0–0.9)
ENA RNP Ab: <0.2 AI (ref 0.0–0.9)
ENA SSA (RO) Ab: <0.2 AI (ref 0.0–0.9)
ENA SSB (LA) Ab: <0.2 AI (ref 0.0–0.9)
RA Latex Turbid.: 11.2 IU/mL (ref 0.0–13.9)
Smith Antibodies: <0.2 AI (ref 0.0–0.9)
dsDNA Ab: <1 IU/mL (ref 0–9)

## 2016-07-04 LAB — C3 AND C4
Complement C3, Serum: 98 mg/dL (ref 82–167)
Complement C4, Serum: 16 mg/dL (ref 14–44)

## 2016-07-04 LAB — ANTINUCLEAR ANTIBODIES, IFA: ANA Titer 1: NEGATIVE

## 2016-07-09 ENCOUNTER — Telehealth (HOSPITAL_COMMUNITY): Payer: Self-pay | Admitting: *Deleted

## 2016-07-09 LAB — MULTIPLE MYELOMA PANEL, SERUM
Albumin SerPl Elph-Mcnc: 4.3 g/dL (ref 2.9–4.4)
Albumin/Glob SerPl: 1.7 (ref 0.7–1.7)
Alpha 1: 0.2 g/dL (ref 0.0–0.4)
Alpha2 Glob SerPl Elph-Mcnc: 0.6 g/dL (ref 0.4–1.0)
B-Globulin SerPl Elph-Mcnc: 0.8 g/dL (ref 0.7–1.3)
Gamma Glob SerPl Elph-Mcnc: 1.1 g/dL (ref 0.4–1.8)
Globulin, Total: 2.6 g/dL (ref 2.2–3.9)
IgA, Qn, Serum: 153 mg/dL (ref 87–352)
IgG, Qn, Serum: 1046 mg/dL (ref 700–1600)
IgM, Qn, Serum: 128 mg/dL (ref 26–217)
Total Protein: 6.9 g/dL (ref 6.0–8.5)

## 2016-07-09 NOTE — Telephone Encounter (Signed)
-----   Message from Twana First, MD sent at 07/09/2016  8:28 AM EST ----- Hi Jahdai Padovano, Can you call Ms. Hubby and let her know that her whole autoimmune workup is negative? Thank you  ----- Message ----- From: SYSTEM Sent: 07/08/2016  12:06 AM To: Twana First, MD

## 2017-01-01 ENCOUNTER — Other Ambulatory Visit (HOSPITAL_COMMUNITY): Payer: BLUE CROSS/BLUE SHIELD

## 2017-01-15 ENCOUNTER — Other Ambulatory Visit (HOSPITAL_COMMUNITY): Payer: BLUE CROSS/BLUE SHIELD

## 2017-01-15 ENCOUNTER — Ambulatory Visit (HOSPITAL_COMMUNITY): Payer: BLUE CROSS/BLUE SHIELD

## 2017-02-04 ENCOUNTER — Other Ambulatory Visit: Payer: Self-pay | Admitting: Dermatology

## 2017-02-05 ENCOUNTER — Other Ambulatory Visit (HOSPITAL_COMMUNITY): Payer: BLUE CROSS/BLUE SHIELD

## 2017-02-05 ENCOUNTER — Ambulatory Visit (HOSPITAL_COMMUNITY): Payer: BLUE CROSS/BLUE SHIELD

## 2017-05-15 IMAGING — DX DG CHEST 2V
2 series · 2 of 2 positions shown · non-contrast
Comparison: None in PACs

CLINICAL DATA: Cough and right-sided chest pain for the past 2
days. Nonsmoker.

EXAM:
CHEST  2 VIEW

[chest pa]
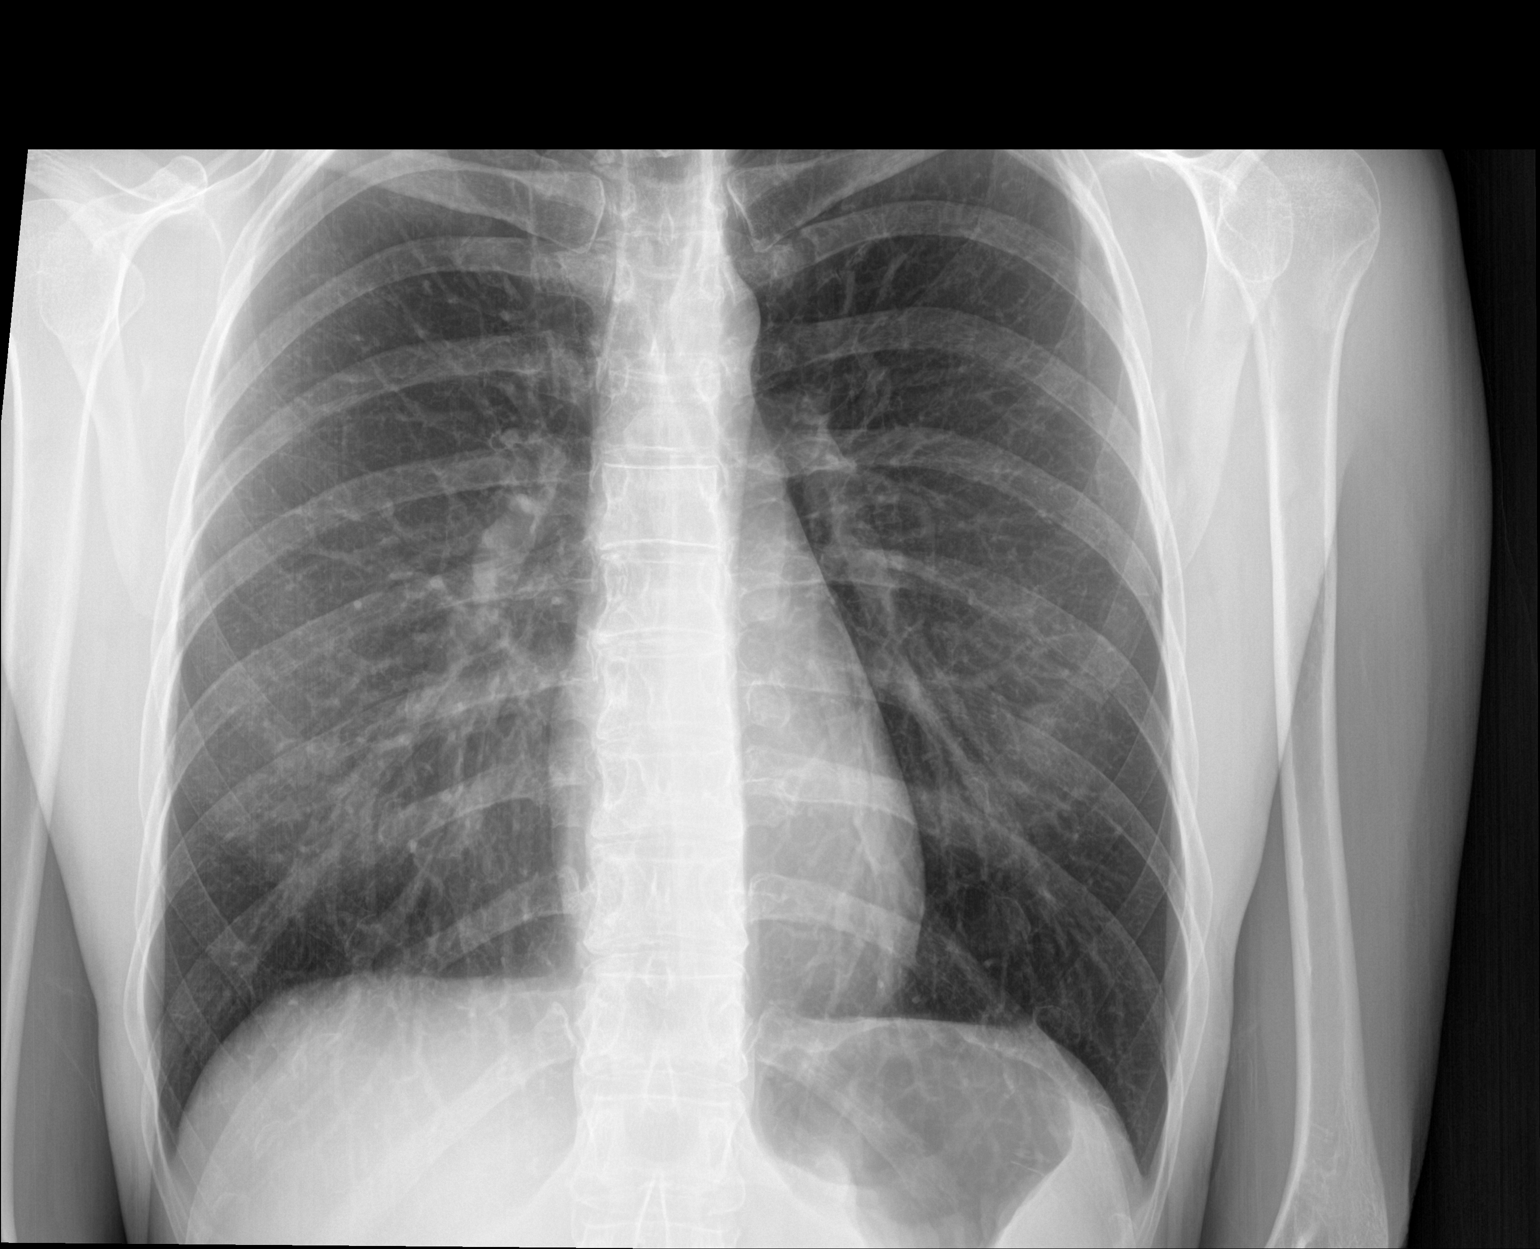

[chest lat]
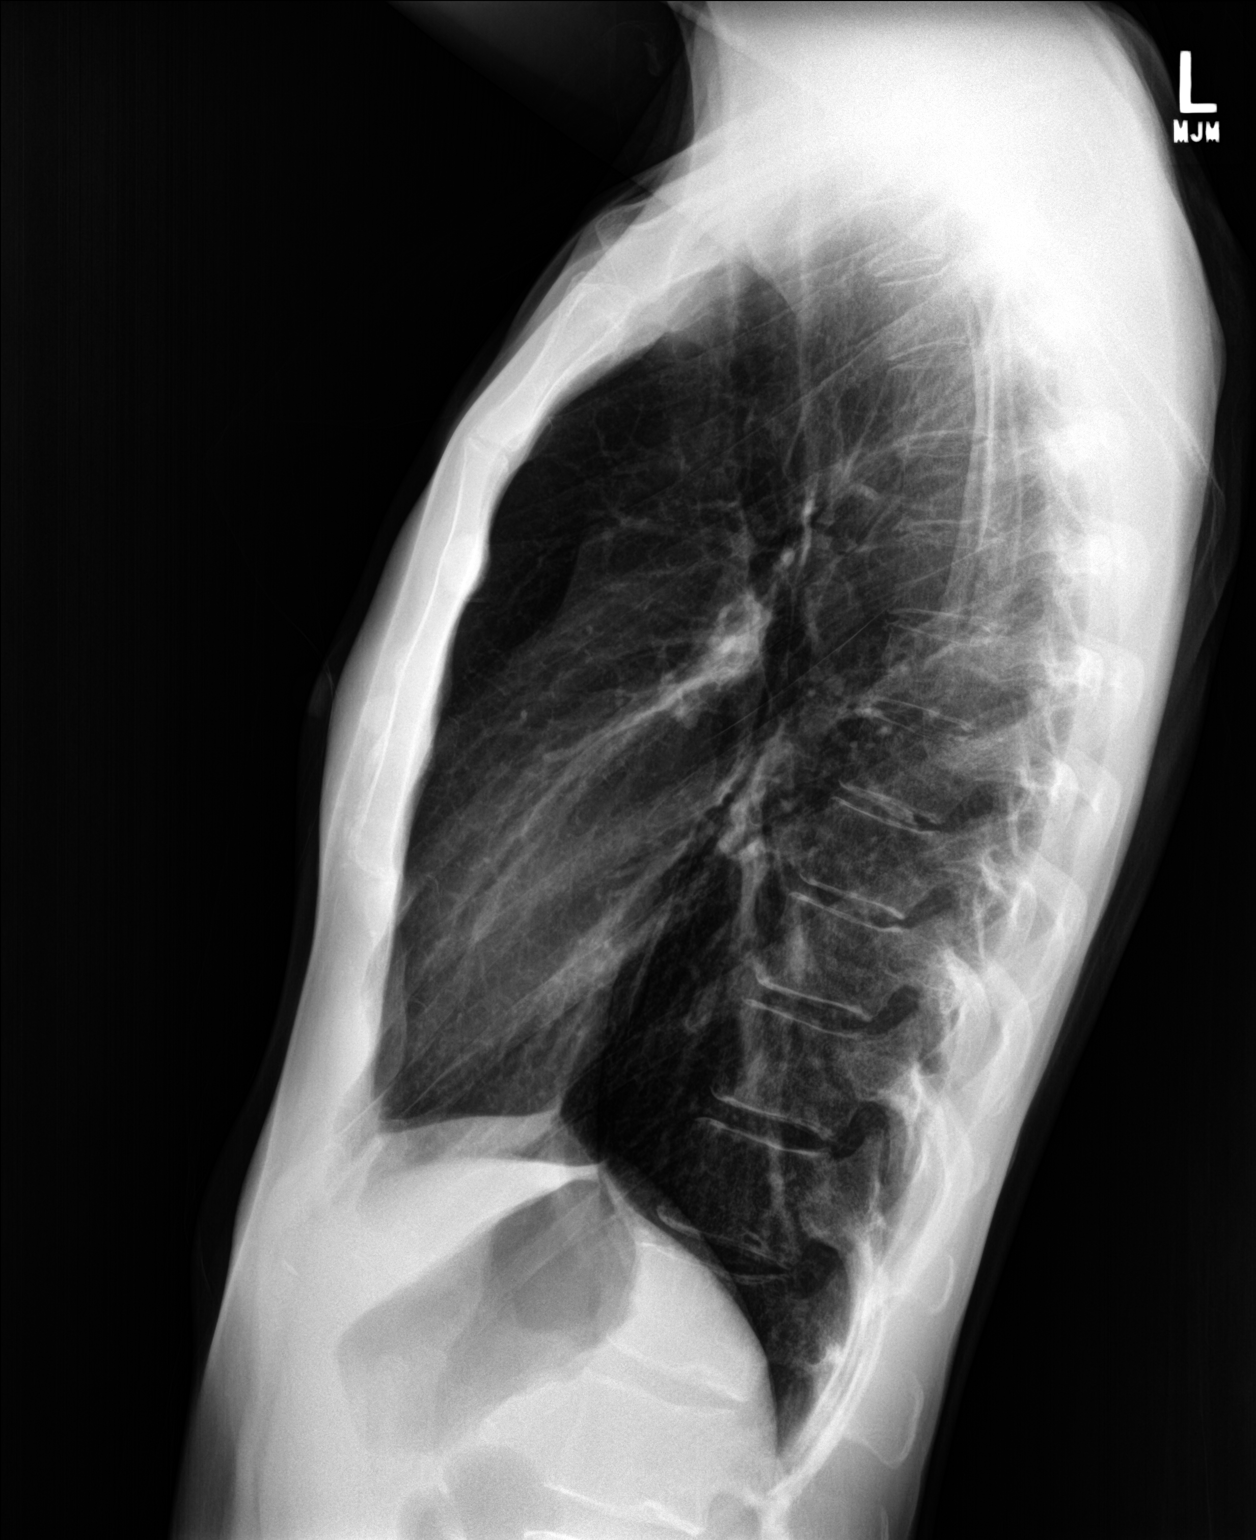

[2 of 2 positions shown; findings below may reference images not displayed]

FINDINGS: The lungs are mildly hyperinflated. There is no focal infiltrate.
There is no pneumothorax, pneumomediastinum, or pleural effusion.
The heart and pulmonary vascularity are normal. The mediastinum is
normal in width. There is no pleural effusion.
IMPRESSION: Hyperinflation which may be voluntary or may reflect reactive airway
disease/acute bronchitis. There is no pneumonia nor other acute
cardiopulmonary abnormality.

## 2017-05-20 ENCOUNTER — Other Ambulatory Visit: Payer: Self-pay | Admitting: Dermatology

## 2017-08-12 ENCOUNTER — Other Ambulatory Visit: Payer: Self-pay | Admitting: Dermatology

## 2017-08-23 ENCOUNTER — Encounter: Payer: Self-pay | Admitting: Obstetrics and Gynecology

## 2017-08-23 ENCOUNTER — Ambulatory Visit (INDEPENDENT_AMBULATORY_CARE_PROVIDER_SITE_OTHER): Payer: BLUE CROSS/BLUE SHIELD | Admitting: Obstetrics and Gynecology

## 2017-08-23 ENCOUNTER — Other Ambulatory Visit: Payer: Self-pay

## 2017-08-23 VITALS — BP 120/80 | HR 72 | Resp 12 | Ht 67.5 in | Wt 126.0 lb

## 2017-08-23 DIAGNOSIS — Z01419 Encounter for gynecological examination (general) (routine) without abnormal findings: Secondary | ICD-10-CM

## 2017-08-23 DIAGNOSIS — Z780 Asymptomatic menopausal state: Secondary | ICD-10-CM | POA: Diagnosis not present

## 2017-08-23 DIAGNOSIS — M8580 Other specified disorders of bone density and structure, unspecified site: Secondary | ICD-10-CM

## 2017-08-23 DIAGNOSIS — Z124 Encounter for screening for malignant neoplasm of cervix: Secondary | ICD-10-CM

## 2017-08-23 NOTE — Progress Notes (Deleted)
Spoke with patient- she was confused about the amount of Vitamin D that was recommended. Advised that Dr. Quincy Simmonds recommended that she take 5000IU daily. Patient voiced understanding -eh

## 2017-08-23 NOTE — Progress Notes (Signed)
47 y.o. G0P0000 MarriedCaucasianF here for annual exam.  PMP x 2 years. She has tolerable hot flashes. No vaginal dryness, no dyspareunia.     No LMP recorded (lmp unknown). Patient is postmenopausal.          Sexually active: Yes.    The current method of family planning is post menopausal status.    Exercising: Yes.    cardio/ weights  Smoker:  no  Health Maintenance: Pap:  unsure History of abnormal Pap:  no MMG:  05-02-15 WNL. She does thermography, last done a few months ago.  Colonoscopy:  Never BMD:   06-03-13 Osteopenia  TDaP:  Unsure, she will f/u with her primary  Gardasil: N/A   reports that she has never smoked. She has never used smokeless tobacco. She reports that she does not drink alcohol or use drugs. She works in Engineer, technical sales  Past Medical History:  Diagnosis Date  . Osteopenia     Past Surgical History:  Procedure Laterality Date  . BREAST SURGERY     fibroids removed as a teen  . DILATION AND CURETTAGE OF UTERUS    . EXTERNAL AUDITORY CANAL RECONSTRUCTION Bilateral   . WISDOM TOOTH EXTRACTION      No current outpatient medications on file.   No current facility-administered medications for this visit.     Family History  Problem Relation Age of Onset  . Thyroid disease Mother   . Depression Mother   . Breast cancer Mother   . Anxiety disorder Mother   . Cancer Father   . Lung cancer Father   . Thyroid disease Sister   Dad with lung cancer, Mom was 45 when diagnosed with breast cancer.   Review of Systems  Constitutional: Negative.   HENT: Negative.   Eyes: Negative.   Respiratory: Negative.   Cardiovascular: Negative.   Gastrointestinal: Negative.   Endocrine: Negative.   Genitourinary: Negative.   Musculoskeletal: Negative.   Skin: Negative.   Allergic/Immunologic: Negative.   Neurological: Negative.   Psychiatric/Behavioral: Negative.     Exam:   BP 120/80 (BP Location: Right Arm, Patient Position: Sitting, Cuff Size: Normal)   Pulse 72    Resp 12   Ht 5' 7.5" (1.715 m)   Wt 126 lb (57.2 kg)   LMP  (LMP Unknown)   BMI 19.44 kg/m   Weight change: @WEIGHTCHANGE @ Height:   Height: 5' 7.5" (171.5 cm)  Ht Readings from Last 3 Encounters:  08/23/17 5' 7.5" (1.715 m)  06/12/16 5\' 8"  (1.727 m)  05/08/16 5\' 8"  (1.727 m)    General appearance: alert, cooperative and appears stated age Head: Normocephalic, without obvious abnormality, atraumatic Neck: no adenopathy, supple, symmetrical, trachea midline and thyroid normal to inspection and palpation Lungs: clear to auscultation bilaterally Cardiovascular: regular rate and rhythm Breasts: normal appearance, no masses or tenderness Abdomen: soft, non-tender; non distended,  no masses,  no organomegaly Extremities: extremities normal, atraumatic, no cyanosis or edema Skin: Skin color, texture, turgor normal. No rashes or lesions Lymph nodes: Cervical, supraclavicular, and axillary nodes normal. No abnormal inguinal nodes palpated Neurologic: Grossly normal   Pelvic: External genitalia:  no lesions              Urethra:  normal appearing urethra with no masses, tenderness or lesions              Bartholins and Skenes: normal                 Vagina: normal appearing vagina  with normal color and discharge, no lesions              Cervix: no lesions               Bimanual Exam:  Uterus:  normal size, contour, position, consistency, mobility, non-tender              Adnexa: no mass, fullness, tenderness               Rectovaginal: Confirms               Anus:  normal sphincter tone, no lesions  Chaperone was present for exam.  A:  Well Woman with normal exam  P:   Declines mammogram, understands the risk, getting thermography  Discussed colon cancer screening, she is going to try to set up a colonoscopy. Discussed IFOB  Discussed breast self exam  Discussed calcium and vit D intake  DEXA with primary  Labs with primary, including vit D

## 2017-08-23 NOTE — Patient Instructions (Signed)
Check your vit d with your primary  EXERCISE AND DIET:  We recommended that you start or continue a regular exercise program for good health. Regular exercise means any activity that makes your heart beat faster and makes you sweat.  We recommend exercising at least 30 minutes per day at least 3 days a week, preferably 4 or 5.  We also recommend a diet low in fat and sugar.  Inactivity, poor dietary choices and obesity can cause diabetes, heart attack, stroke, and kidney damage, among others.    ALCOHOL AND SMOKING:  Women should limit their alcohol intake to no more than 7 drinks/beers/glasses of wine (combined, not each!) per week. Moderation of alcohol intake to this level decreases your risk of breast cancer and liver damage. And of course, no recreational drugs are part of a healthy lifestyle.  And absolutely no smoking or even second hand smoke. Most people know smoking can cause heart and lung diseases, but did you know it also contributes to weakening of your bones? Aging of your skin?  Yellowing of your teeth and nails?  CALCIUM AND VITAMIN D:  Adequate intake of calcium and Vitamin D are recommended.  The recommendations for exact amounts of these supplements seem to change often, but generally speaking 600 mg of calcium (either carbonate or citrate) and 800 units of Vitamin D per day seems prudent. Certain women may benefit from higher intake of Vitamin D.  If you are among these women, your doctor will have told you during your visit.    PAP SMEARS:  Pap smears, to check for cervical cancer or precancers,  have traditionally been done yearly, although recent scientific advances have shown that most women can have pap smears less often.  However, every woman still should have a physical exam from her gynecologist every year. It will include a breast check, inspection of the vulva and vagina to check for abnormal growths or skin changes, a visual exam of the cervix, and then an exam to evaluate the  size and shape of the uterus and ovaries.  And after 48 years of age, a rectal exam is indicated to check for rectal cancers. We will also provide age appropriate advice regarding health maintenance, like when you should have certain vaccines, screening for sexually transmitted diseases, bone density testing, colonoscopy, mammograms, etc.   MAMMOGRAMS:  All women over 48 years old should have a yearly mammogram. Many facilities now offer a "3D" mammogram, which may cost around $50 extra out of pocket. If possible,  we recommend you accept the option to have the 3D mammogram performed.  It both reduces the number of women who will be called back for extra views which then turn out to be normal, and it is better than the routine mammogram at detecting truly abnormal areas.    COLONOSCOPY:  Colonoscopy to screen for colon cancer is recommended for all women at age 48.  We know, you hate the idea of the prep.  We agree, BUT, having colon cancer and not knowing it is worse!!  Colon cancer so often starts as a polyp that can be seen and removed at colonscopy, which can quite literally save your life!  And if your first colonoscopy is normal and you have no family history of colon cancer, most women don't have to have it again for 10 years.  Once every ten years, you can do something that may end up saving your life, right?  We will be happy to help  you get it scheduled when you are ready.  Be sure to check your insurance coverage so you understand how much it will cost.  It may be covered as a preventative service at no cost, but you should check your particular policy.

## 2017-08-28 ENCOUNTER — Other Ambulatory Visit (HOSPITAL_COMMUNITY)
Admission: RE | Admit: 2017-08-28 | Discharge: 2017-08-28 | Disposition: A | Discharge status: Home / Self Care | Payer: BLUE CROSS/BLUE SHIELD | Source: Ambulatory Visit | Attending: Obstetrics and Gynecology | Admitting: Obstetrics and Gynecology

## 2017-08-28 DIAGNOSIS — Z01419 Encounter for gynecological examination (general) (routine) without abnormal findings: Secondary | ICD-10-CM | POA: Diagnosis not present

## 2017-08-28 NOTE — Addendum Note (Signed)
Addended by: Dorothy Spark on: 08/28/2017 05:07 PM   Modules accepted: Orders

## 2017-08-30 LAB — CYTOLOGY - PAP
Diagnosis: NEGATIVE
HPV: NOT DETECTED

## 2018-03-20 DIAGNOSIS — H903 Sensorineural hearing loss, bilateral: Secondary | ICD-10-CM | POA: Insufficient documentation

## 2018-09-25 ENCOUNTER — Ambulatory Visit: Payer: BLUE CROSS/BLUE SHIELD | Admitting: Obstetrics and Gynecology

## 2018-10-20 NOTE — Progress Notes (Signed)
48 y.o. G0P0000 Married White or Caucasian Not Hispanic or Latino female here for annual exam.  PMP, no bleeding. Tolerable vasomotor symptoms. No dyspareunia.  No medical changes.     No LMP recorded (lmp unknown). Patient is postmenopausal.          Sexually active: Yes.    The current method of family planning is post menopausal status.    Exercising: Yes.    cardio Smoker:  no  Health Maintenance: Pap:  08/28/2017 WNL NEG HPV, 04/08/14 WNL, NEG HPV History of abnormal Pap:  no MMG:  04/22/2015 Birads 1 negative, does thermography now had it done last week Colonoscopy:  Never BMD:   06-03-13 Osteopenia  TDaP:  UTD with primary care provider Gardasil: N/A   reports that she has never smoked. She has never used smokeless tobacco. She reports that she does not drink alcohol or use drugs. Works in Engineer, technical sales.   Past Medical History:  Diagnosis Date  . Osteopenia     Past Surgical History:  Procedure Laterality Date  . BREAST SURGERY     fibroids removed as a teen  . DILATION AND CURETTAGE OF UTERUS    . EXTERNAL AUDITORY CANAL RECONSTRUCTION Bilateral   . WISDOM TOOTH EXTRACTION      No current outpatient medications on file.   No current facility-administered medications for this visit.     Family History  Problem Relation Age of Onset  . Thyroid disease Mother   . Depression Mother   . Breast cancer Mother   . Anxiety disorder Mother   . Cancer Father   . Lung cancer Father   . Thyroid disease Sister   Mom with breast cancer at 67.  Maternal 1st cousin with breast cancer in her late 63's Paternal 1st cousin with breast cancer in her late 76's  Review of Systems  Constitutional: Negative.   HENT: Negative.   Eyes: Negative.   Respiratory: Negative.   Cardiovascular: Negative.   Gastrointestinal: Negative.   Endocrine: Negative.   Genitourinary: Negative.   Musculoskeletal: Negative.   Skin: Negative.   Allergic/Immunologic: Negative.   Neurological: Negative.    Hematological: Negative.   Psychiatric/Behavioral: Negative.     Exam:   BP 114/72 (BP Location: Left Arm, Patient Position: Sitting, Cuff Size: Normal)   Pulse 76   Temp 97.9 F (36.6 C) (Skin)   Ht 5\' 8"  (1.727 m)   Wt 128 lb 11.2 oz (58.4 kg)   LMP  (LMP Unknown)   BMI 19.57 kg/m   Weight change: @WEIGHTCHANGE @ Height:   Height: 5\' 8"  (172.7 cm)  Ht Readings from Last 3 Encounters:  10/21/18 5\' 8"  (1.727 m)  08/23/17 5' 7.5" (1.715 m)  06/12/16 5\' 8"  (1.727 m)    General appearance: alert, cooperative and appears stated age Head: Normocephalic, without obvious abnormality, atraumatic Neck: no adenopathy, supple, symmetrical, trachea midline and thyroid normal to inspection and palpation Lungs: clear to auscultation bilaterally Cardiovascular: regular rate and rhythm Breasts: normal appearance, no masses or tenderness Abdomen: soft, non-tender; non distended,  no masses,  no organomegaly Extremities: extremities normal, atraumatic, no cyanosis or edema Skin: Skin color, texture, turgor normal. No rashes or lesions Lymph nodes: Cervical, supraclavicular, and axillary nodes normal. No abnormal inguinal nodes palpated Neurologic: Grossly normal   Pelvic: External genitalia:  no lesions              Urethra:  normal appearing urethra with no masses, tenderness or lesions  Bartholins and Skenes: normal                 Vagina: mildly atrophic appearing vagina with normal color and discharge, no lesions              Cervix: no lesions               Bimanual Exam:  Uterus:  normal size, contour, position, consistency, mobility, non-tender              Adnexa: no mass, fullness, tenderness               Rectovaginal: Confirms               Anus:  normal sphincter tone, no lesions  Chaperone was present for exam.  A:  Well Woman with normal exam  FH of breast cancer  P:   Labs with primary (done in September)  No pap this year  Discussed breast self  exam  Discussed calcium and vit D intake  She just did Thermography last week  We discussed mammogram  We discussed genetic counseling  Given information from UTD on breast cancer screening

## 2018-10-21 ENCOUNTER — Encounter: Payer: Self-pay | Admitting: Obstetrics and Gynecology

## 2018-10-21 ENCOUNTER — Other Ambulatory Visit: Payer: Self-pay

## 2018-10-21 ENCOUNTER — Ambulatory Visit: Payer: BC Managed Care – PPO | Admitting: Obstetrics and Gynecology

## 2018-10-21 VITALS — BP 114/72 | HR 76 | Temp 97.9°F | Ht 68.0 in | Wt 128.7 lb

## 2018-10-21 DIAGNOSIS — Z01419 Encounter for gynecological examination (general) (routine) without abnormal findings: Secondary | ICD-10-CM

## 2018-10-21 NOTE — Patient Instructions (Signed)
EXERCISE AND DIET:  We recommended that you start or continue a regular exercise program for good health. Regular exercise means any activity that makes your heart beat faster and makes you sweat.  We recommend exercising at least 30 minutes per day at least 3 days a week, preferably 4 or 5.  We also recommend a diet low in fat and sugar.  Inactivity, poor dietary choices and obesity can cause diabetes, heart attack, stroke, and kidney damage, among others.    ALCOHOL AND SMOKING:  Women should limit their alcohol intake to no more than 7 drinks/beers/glasses of wine (combined, not each!) per week. Moderation of alcohol intake to this level decreases your risk of breast cancer and liver damage. And of course, no recreational drugs are part of a healthy lifestyle.  And absolutely no smoking or even second hand smoke. Most people know smoking can cause heart and lung diseases, but did you know it also contributes to weakening of your bones? Aging of your skin?  Yellowing of your teeth and nails?  CALCIUM AND VITAMIN D:  Adequate intake of calcium and Vitamin D are recommended.  The recommendations for exact amounts of these supplements seem to change often, but generally speaking 1,200 mg of calcium (between diet and supplement) and 800 units of Vitamin D per day seems prudent. Certain women may benefit from higher intake of Vitamin D.  If you are among these women, your doctor will have told you during your visit.    PAP SMEARS:  Pap smears, to check for cervical cancer or precancers,  have traditionally been done yearly, although recent scientific advances have shown that most women can have pap smears less often.  However, every woman still should have a physical exam from her gynecologist every year. It will include a breast check, inspection of the vulva and vagina to check for abnormal growths or skin changes, a visual exam of the cervix, and then an exam to evaluate the size and shape of the uterus and  ovaries.  And after 49 years of age, a rectal exam is indicated to check for rectal cancers. We will also provide age appropriate advice regarding health maintenance, like when you should have certain vaccines, screening for sexually transmitted diseases, bone density testing, colonoscopy, mammograms, etc.   MAMMOGRAMS:  All women over 49 years old should have a yearly mammogram. Many facilities now offer a "3D" mammogram, which may cost around $50 extra out of pocket. If possible,  we recommend you accept the option to have the 3D mammogram performed.  It both reduces the number of women who will be called back for extra views which then turn out to be normal, and it is better than the routine mammogram at detecting truly abnormal areas.    COLON CANCER SCREENING: Now recommend starting at age 49. At this time colonoscopy is not covered for routine screening until 50. There are take home tests that can be done between 49-49.   COLONOSCOPY:  Colonoscopy to screen for colon cancer is recommended for all women at age 49.  We know, you hate the idea of the prep.  We agree, BUT, having colon cancer and not knowing it is worse!!  Colon cancer so often starts as a polyp that can be seen and removed at colonscopy, which can quite literally save your life!  And if your first colonoscopy is normal and you have no family history of colon cancer, most women don't have to have it again for   10 years.  Once every ten years, you can do something that may end up saving your life, right?  We will be happy to help you get it scheduled when you are ready.  Be sure to check your insurance coverage so you understand how much it will cost.  It may be covered as a preventative service at no cost, but you should check your particular policy.      Breast Self-Awareness Breast self-awareness means being familiar with how your breasts look and feel. It involves checking your breasts regularly and reporting any changes to your  health care provider. Practicing breast self-awareness is important. A change in your breasts can be a sign of a serious medical problem. Being familiar with how your breasts look and feel allows you to find any problems early, when treatment is more likely to be successful. All women should practice breast self-awareness, including women who have had breast implants. How to do a breast self-exam One way to learn what is normal for your breasts and whether your breasts are changing is to do a breast self-exam. To do a breast self-exam: Look for Changes  1. Remove all the clothing above your waist. 2. Stand in front of a mirror in a room with good lighting. 3. Put your hands on your hips. 4. Push your hands firmly downward. 5. Compare your breasts in the mirror. Look for differences between them (asymmetry), such as: ? Differences in shape. ? Differences in size. ? Puckers, dips, and bumps in one breast and not the other. 6. Look at each breast for changes in your skin, such as: ? Redness. ? Scaly areas. 7. Look for changes in your nipples, such as: ? Discharge. ? Bleeding. ? Dimpling. ? Redness. ? A change in position. Feel for Changes Carefully feel your breasts for lumps and changes. It is best to do this while lying on your back on the floor and again while sitting or standing in the shower or tub with soapy water on your skin. Feel each breast in the following way:  Place the arm on the side of the breast you are examining above your head.  Feel your breast with the other hand.  Start in the nipple area and make  inch (2 cm) overlapping circles to feel your breast. Use the pads of your three middle fingers to do this. Apply light pressure, then medium pressure, then firm pressure. The light pressure will allow you to feel the tissue closest to the skin. The medium pressure will allow you to feel the tissue that is a little deeper. The firm pressure will allow you to feel the tissue  close to the ribs.  Continue the overlapping circles, moving downward over the breast until you feel your ribs below your breast.  Move one finger-width toward the center of the body. Continue to use the  inch (2 cm) overlapping circles to feel your breast as you move slowly up toward your collarbone.  Continue the up and down exam using all three pressures until you reach your armpit.  Write Down What You Find  Write down what is normal for each breast and any changes that you find. Keep a written record with breast changes or normal findings for each breast. By writing this information down, you do not need to depend only on memory for size, tenderness, or location. Write down where you are in your menstrual cycle, if you are still menstruating. If you are having trouble noticing differences   in your breasts, do not get discouraged. With time you will become more familiar with the variations in your breasts and more comfortable with the exam. How often should I examine my breasts? Examine your breasts every month. If you are breastfeeding, the best time to examine your breasts is after a feeding or after using a breast pump. If you menstruate, the best time to examine your breasts is 5-7 days after your period is over. During your period, your breasts are lumpier, and it may be more difficult to notice changes. When should I see my health care provider? See your health care provider if you notice:  A change in shape or size of your breasts or nipples.  A change in the skin of your breast or nipples, such as a reddened or scaly area.  Unusual discharge from your nipples.  A lump or thick area that was not there before.  Pain in your breasts.  Anything that concerns you.  

## 2018-10-28 ENCOUNTER — Telehealth: Payer: Self-pay

## 2018-10-28 NOTE — Telephone Encounter (Signed)
Left message to call [place] at [phone].  Need to advise patient that RN has spoken with [facility] and that she will have to have a screening mammogram performed prior to having an abbreviated MRI. Also must have a risk assessment completed that indicates she has a 15%-20% risk for breast cancer.

## 2018-10-30 NOTE — Telephone Encounter (Signed)
Spoke with patient. Information provided as seen below. Patient verbalizes understanding. States that she would like to think about options and return call if she decides to proceed.  Routing to provider and will close encounter.

## 2018-12-02 ENCOUNTER — Encounter: Payer: Self-pay | Admitting: *Deleted

## 2019-05-20 ENCOUNTER — Ambulatory Visit: Payer: BC Managed Care – PPO | Attending: Internal Medicine

## 2019-05-20 ENCOUNTER — Other Ambulatory Visit: Payer: Self-pay

## 2019-05-20 DIAGNOSIS — Z20822 Contact with and (suspected) exposure to covid-19: Secondary | ICD-10-CM

## 2019-05-21 LAB — NOVEL CORONAVIRUS, NAA: SARS-CoV-2, NAA: DETECTED — AB

## 2019-10-27 NOTE — Progress Notes (Signed)
49 y.o. G0P0000 Married White or Caucasian Not Hispanic or Latino female here for annual exam. PMP, stopped having cycles around are 44-45. Still having hot flashes, tolerable. No bleeding. Sexually active, no pain. No bowel or bladder c/o.     No LMP recorded (lmp unknown). Patient is postmenopausal.          Sexually active: Yes.    The current method of family planning is post menopausal status.    Exercising: Yes.    Cardio and weights Smoker:  no  Health Maintenance: Pap:  08/28/2017 WNL NEG HPV, 04/08/14 WNL, NEG HPV History of abnormal Pap:  no MMG:04/22/2015 Birads 1 negative, does thermography now 10/18/19 normal per patient. A view to your health.  BMD:  06-03-13 Osteopenia  Colonoscopy: no TDaP:  UTD with primary  Gardasil: N/A   reports that she has never smoked. She has never used smokeless tobacco. She reports that she does not drink alcohol and does not use drugs. Works in Engineer, technical sales  Past Medical History:  Diagnosis Date  . Atypical nevus 10/29/2001   CENTER MID BACK( SLIGHT)- NO TX CLEAR  . Atypical nevus 06/07/2008   RIGHT THIGH ( MARKED) -TX WIDER SHAVE  . Osteopenia   . SCCA (squamous cell carcinoma) of skin 07/08/2000   UPPER RIGHT FOREHEAD- TX CURET X 3, 5FU    Past Surgical History:  Procedure Laterality Date  . BREAST SURGERY     fibroids removed as a teen  . DILATION AND CURETTAGE OF UTERUS    . EXTERNAL AUDITORY CANAL RECONSTRUCTION Bilateral   . WISDOM TOOTH EXTRACTION      No current outpatient medications on file.   No current facility-administered medications for this visit.    Family History  Problem Relation Age of Onset  . Thyroid disease Mother   . Depression Mother   . Breast cancer Mother   . Anxiety disorder Mother   . Cancer Father   . Lung cancer Father   . Thyroid disease Sister   Mom with breast cancer at 46.  Maternal 1st cousin with breast cancer in her late 43's Paternal 1st cousin with breast cancer in her late 40's  Another  1st maternal cousin with breast cancer in her late 68's.  MUnlce and MAunt with colon cancer, both in their late 70's  Review of Systems  Constitutional: Negative.   HENT: Negative.   Eyes: Negative.   Respiratory: Negative.   Cardiovascular: Negative.   Gastrointestinal: Negative.   Endocrine: Negative.   Genitourinary: Negative.   Musculoskeletal: Negative.   Skin: Negative.   Allergic/Immunologic: Negative.   Neurological: Negative.   Hematological: Negative.   Psychiatric/Behavioral: Negative.     Exam:   BP 100/62   Pulse 72   Temp 97.9 F (36.6 C)   Ht 5' 7.5" (1.715 m)   Wt 128 lb (58.1 kg)   LMP  (LMP Unknown)   SpO2 98%   BMI 19.75 kg/m   Weight change: @WEIGHTCHANGE @ Height:   Height: 5' 7.5" (171.5 cm)  Ht Readings from Last 3 Encounters:  10/29/19 5' 7.5" (1.715 m)  10/21/18 5\' 8"  (1.727 m)  08/23/17 5' 7.5" (1.715 m)    General appearance: alert, cooperative and appears stated age Head: Normocephalic, without obvious abnormality, atraumatic Neck: no adenopathy, supple, symmetrical, trachea midline and thyroid normal to inspection and palpation Lungs: clear to auscultation bilaterally Cardiovascular: regular rate and rhythm Breasts: normal appearance, no masses or tenderness Abdomen: soft, non-tender; non distended,  no masses,  no organomegaly Extremities: extremities normal, atraumatic, no cyanosis or edema Skin: Skin color, texture, turgor normal. No rashes or lesions Lymph nodes: Cervical, supraclavicular, and axillary nodes normal. No abnormal inguinal nodes palpated Neurologic: Grossly normal   Pelvic: External genitalia:  no lesions              Urethra:  normal appearing urethra with no masses, tenderness or lesions              Bartholins and Skenes: normal                 Vagina: atrophic appearing vagina with normal color and discharge, no lesions              Cervix: no lesions               Bimanual Exam:  Uterus:  normal size,  contour, position, consistency, mobility, non-tender              Adnexa: no mass, fullness, tenderness               Rectovaginal: Confirms               Anus:  normal sphincter tone, no lesions  Gae Dry chaperoned for the exam.  A:  Well Woman with normal exam  FH of breast and colon cancer  H/O osteopenia  P:   No pap this year  Labs with her primary  Discussed breast self exam  Discussed calcium and vit D intake  Discussed referral to genetic counselor, she will consider and let me know. If her risk of breast cancer is between 15 and 19% she would be eligible for an abbreviated breast MRI  Place referral for colonoscopy (for after she turns 49)  Discussed mammogram, reviewed risks and benefits. Discussed that mammogram is the gold standard for breast cancer screening. Thermography is not recommended in place of mammogram

## 2019-10-28 ENCOUNTER — Other Ambulatory Visit: Payer: Self-pay

## 2019-10-29 ENCOUNTER — Encounter: Payer: Self-pay | Admitting: Obstetrics and Gynecology

## 2019-10-29 ENCOUNTER — Ambulatory Visit: Payer: BC Managed Care – PPO | Admitting: Obstetrics and Gynecology

## 2019-10-29 VITALS — BP 100/62 | HR 72 | Temp 97.9°F | Ht 67.5 in | Wt 128.0 lb

## 2019-10-29 DIAGNOSIS — Z01419 Encounter for gynecological examination (general) (routine) without abnormal findings: Secondary | ICD-10-CM | POA: Diagnosis not present

## 2019-10-29 DIAGNOSIS — Z8739 Personal history of other diseases of the musculoskeletal system and connective tissue: Secondary | ICD-10-CM | POA: Diagnosis not present

## 2019-10-29 DIAGNOSIS — Z803 Family history of malignant neoplasm of breast: Secondary | ICD-10-CM | POA: Diagnosis not present

## 2019-10-29 DIAGNOSIS — Z800 Family history of malignant neoplasm of digestive organs: Secondary | ICD-10-CM

## 2019-10-29 DIAGNOSIS — N951 Menopausal and female climacteric states: Secondary | ICD-10-CM | POA: Insufficient documentation

## 2019-10-29 DIAGNOSIS — Z1211 Encounter for screening for malignant neoplasm of colon: Secondary | ICD-10-CM

## 2019-10-29 DIAGNOSIS — N63 Unspecified lump in unspecified breast: Secondary | ICD-10-CM | POA: Insufficient documentation

## 2019-10-29 DIAGNOSIS — N939 Abnormal uterine and vaginal bleeding, unspecified: Secondary | ICD-10-CM | POA: Insufficient documentation

## 2019-10-29 DIAGNOSIS — B9689 Other specified bacterial agents as the cause of diseases classified elsewhere: Secondary | ICD-10-CM | POA: Insufficient documentation

## 2019-10-29 NOTE — Patient Instructions (Signed)
EXERCISE AND DIET:  We recommended that you start or continue a regular exercise program for good health. Regular exercise means any activity that makes your heart beat faster and makes you sweat.  We recommend exercising at least 30 minutes per day at least 3 days a week, preferably 4 or 5.  We also recommend a diet low in fat and sugar.  Inactivity, poor dietary choices and obesity can cause diabetes, heart attack, stroke, and kidney damage, among others.    ALCOHOL AND SMOKING:  Women should limit their alcohol intake to no more than 7 drinks/beers/glasses of wine (combined, not each!) per week. Moderation of alcohol intake to this level decreases your risk of breast cancer and liver damage. And of course, no recreational drugs are part of a healthy lifestyle.  And absolutely no smoking or even second hand smoke. Most people know smoking can cause heart and lung diseases, but did you know it also contributes to weakening of your bones? Aging of your skin?  Yellowing of your teeth and nails?  CALCIUM AND VITAMIN D:  Adequate intake of calcium and Vitamin D are recommended.  The recommendations for exact amounts of these supplements seem to change often, but generally speaking 1,200 mg of calcium (between diet and supplement) and 800 units of Vitamin D per day seems prudent. Certain women may benefit from higher intake of Vitamin D.  If you are among these women, your doctor will have told you during your visit.    PAP SMEARS:  Pap smears, to check for cervical cancer or precancers,  have traditionally been done yearly, although recent scientific advances have shown that most women can have pap smears less often.  However, every woman still should have a physical exam from her gynecologist every year. It will include a breast check, inspection of the vulva and vagina to check for abnormal growths or skin changes, a visual exam of the cervix, and then an exam to evaluate the size and shape of the uterus and  ovaries.  And after 50 years of age, a rectal exam is indicated to check for rectal cancers. We will also provide age appropriate advice regarding health maintenance, like when you should have certain vaccines, screening for sexually transmitted diseases, bone density testing, colonoscopy, mammograms, etc.   MAMMOGRAMS:  All women over 40 years old should have a yearly mammogram. Many facilities now offer a "3D" mammogram, which may cost around $50 extra out of pocket. If possible,  we recommend you accept the option to have the 3D mammogram performed.  It both reduces the number of women who will be called back for extra views which then turn out to be normal, and it is better than the routine mammogram at detecting truly abnormal areas.    COLON CANCER SCREENING: Now recommend starting at age 45. At this time colonoscopy is not covered for routine screening until 50. There are take home tests that can be done between 45-49.   COLONOSCOPY:  Colonoscopy to screen for colon cancer is recommended for all women at age 50.  We know, you hate the idea of the prep.  We agree, BUT, having colon cancer and not knowing it is worse!!  Colon cancer so often starts as a polyp that can be seen and removed at colonscopy, which can quite literally save your life!  And if your first colonoscopy is normal and you have no family history of colon cancer, most women don't have to have it again for   10 years.  Once every ten years, you can do something that may end up saving your life, right?  We will be happy to help you get it scheduled when you are ready.  Be sure to check your insurance coverage so you understand how much it will cost.  It may be covered as a preventative service at no cost, but you should check your particular policy.      Breast Self-Awareness Breast self-awareness means being familiar with how your breasts look and feel. It involves checking your breasts regularly and reporting any changes to your  health care provider. Practicing breast self-awareness is important. A change in your breasts can be a sign of a serious medical problem. Being familiar with how your breasts look and feel allows you to find any problems early, when treatment is more likely to be successful. All women should practice breast self-awareness, including women who have had breast implants. How to do a breast self-exam One way to learn what is normal for your breasts and whether your breasts are changing is to do a breast self-exam. To do a breast self-exam: Look for Changes  1. Remove all the clothing above your waist. 2. Stand in front of a mirror in a room with good lighting. 3. Put your hands on your hips. 4. Push your hands firmly downward. 5. Compare your breasts in the mirror. Look for differences between them (asymmetry), such as: ? Differences in shape. ? Differences in size. ? Puckers, dips, and bumps in one breast and not the other. 6. Look at each breast for changes in your skin, such as: ? Redness. ? Scaly areas. 7. Look for changes in your nipples, such as: ? Discharge. ? Bleeding. ? Dimpling. ? Redness. ? A change in position. Feel for Changes Carefully feel your breasts for lumps and changes. It is best to do this while lying on your back on the floor and again while sitting or standing in the shower or tub with soapy water on your skin. Feel each breast in the following way:  Place the arm on the side of the breast you are examining above your head.  Feel your breast with the other hand.  Start in the nipple area and make  inch (2 cm) overlapping circles to feel your breast. Use the pads of your three middle fingers to do this. Apply light pressure, then medium pressure, then firm pressure. The light pressure will allow you to feel the tissue closest to the skin. The medium pressure will allow you to feel the tissue that is a little deeper. The firm pressure will allow you to feel the tissue  close to the ribs.  Continue the overlapping circles, moving downward over the breast until you feel your ribs below your breast.  Move one finger-width toward the center of the body. Continue to use the  inch (2 cm) overlapping circles to feel your breast as you move slowly up toward your collarbone.  Continue the up and down exam using all three pressures until you reach your armpit.  Write Down What You Find  Write down what is normal for each breast and any changes that you find. Keep a written record with breast changes or normal findings for each breast. By writing this information down, you do not need to depend only on memory for size, tenderness, or location. Write down where you are in your menstrual cycle, if you are still menstruating. If you are having trouble noticing differences   in your breasts, do not get discouraged. With time you will become more familiar with the variations in your breasts and more comfortable with the exam. How often should I examine my breasts? Examine your breasts every month. If you are breastfeeding, the best time to examine your breasts is after a feeding or after using a breast pump. If you menstruate, the best time to examine your breasts is 5-7 days after your period is over. During your period, your breasts are lumpier, and it may be more difficult to notice changes. When should I see my health care provider? See your health care provider if you notice:  A change in shape or size of your breasts or nipples.  A change in the skin of your breast or nipples, such as a reddened or scaly area.  Unusual discharge from your nipples.  A lump or thick area that was not there before.  Pain in your breasts.  Anything that concerns you.  

## 2019-11-03 ENCOUNTER — Telehealth: Payer: Self-pay

## 2019-11-03 NOTE — Telephone Encounter (Signed)
Left message for pt to return call to triage RN. 

## 2019-11-03 NOTE — Telephone Encounter (Signed)
-----   Message from Salvadore Dom, MD sent at 10/30/2019  5:47 PM EDT ----- Regarding: RE: abbreviated breast MRI Please let the patient know. She doesn't know her risk yet, we discussed sending her to genetic counseling. I just want her to be aware of the policy in case the effects her desire to go to genetics.  ----- Message ----- From: Georgia Lopes, RN Sent: 10/29/2019   8:58 AM EDT To: Salvadore Dom, MD Subject: abbreviated breast MRI                         Hey Dr Talbert Nan,   I called the [facility] and pt will need updated screening MMG before having the abbreviated breast MRI, even with her lifetime risk.   Colletta [place]

## 2019-11-18 ENCOUNTER — Other Ambulatory Visit: Payer: Self-pay | Admitting: Obstetrics and Gynecology

## 2019-11-18 NOTE — Telephone Encounter (Signed)
Patient thinks she may have a uti and would like to see Dr.Jertson. To triage to assist with scheduling.

## 2019-11-18 NOTE — Telephone Encounter (Signed)
Spoke with pt. Pt states having UTI sx x 4 days. Pt states having urinary frequency, urgency, slight odor to urine and abd cramps. Denies vaginal bleeding, discharge, back pain, fever or chills.  Pt states has used OTC pyridium and increased water intake to 70 oz daily. Pt states used pyridium last on Tuesday night.   Pt states wanting to drop off urine and receive Rx. Pt advised to have OV. Pt agreeable. Spoke with Eustace Pen, CNM and was advised no appts available today with Dr Talbert Nan. Pt states is going out of town tomorrow and will see PCP or Urgent care today for sx. Pt agreeable and verbalized understanding.   Routing to Dr Talbert Nan for review.  Encounter closed.

## 2019-11-19 ENCOUNTER — Other Ambulatory Visit: Payer: Self-pay

## 2019-11-19 ENCOUNTER — Ambulatory Visit
Admission: EM | Admit: 2019-11-19 | Discharge: 2019-11-19 | Disposition: A | Discharge status: Home / Self Care | Payer: BC Managed Care – PPO | Attending: Emergency Medicine | Admitting: Emergency Medicine

## 2019-11-19 DIAGNOSIS — R8290 Unspecified abnormal findings in urine: Secondary | ICD-10-CM | POA: Insufficient documentation

## 2019-11-19 DIAGNOSIS — R300 Dysuria: Secondary | ICD-10-CM | POA: Insufficient documentation

## 2019-11-19 LAB — POCT URINALYSIS DIP (MANUAL ENTRY)
Bilirubin, UA: NEGATIVE
Glucose, UA: NEGATIVE mg/dL
Ketones, POC UA: NEGATIVE mg/dL
Nitrite, UA: NEGATIVE
Protein Ur, POC: NEGATIVE mg/dL
Spec Grav, UA: 1.02 (ref 1.010–1.025)
Urobilinogen, UA: 0.2 E.U./dL
pH, UA: 6 (ref 5.0–8.0)

## 2019-11-19 MED ORDER — PHENAZOPYRIDINE HCL 200 MG PO TABS
200.0000 mg | ORAL_TABLET | Freq: Three times a day (TID) | ORAL | 0 refills | Status: DC
Start: 2019-11-19 — End: 2019-11-26

## 2019-11-19 MED ORDER — NITROFURANTOIN MONOHYD MACRO 100 MG PO CAPS: 100.0000 mg | ORAL_CAPSULE | Freq: Two times a day (BID) | ORAL | 0 refills | Status: DC

## 2019-11-19 NOTE — Discharge Instructions (Signed)
Urine culture sent.  We will call you with the results.   Push fluids and get plenty of rest.   Take antibiotic as directed and to completion Take pyridium as prescribed and as needed for symptomatic relief Follow up with PCP if symptoms persists Return here or go to ER if you have any new or worsening symptoms such as fever, worsening abdominal pain, nausea/vomiting, flank pain, etc... 

## 2019-11-19 NOTE — ED Triage Notes (Signed)
Pt presents with c/o dysuria and odor that began on Sunday

## 2019-11-19 NOTE — ED Provider Notes (Signed)
[facility]   CC: Burning with urination  SUBJECTIVE:  Maria Hayes is a 49 y.o. female who complains of urinary frequency, urgency and dysuria for the past 4 days.  Complains of delayed bathroom breaks, and decreased water intake.  Complains of mild dull abdominal cramping.  Has tried OTC medications without relief.  Symptoms are made worse with urination.  Admits to similar symptoms in the past. Complains of decreased appetite.   Denies fever, chills, nausea, vomiting, flank pain, abnormal vaginal discharge or bleeding, vaginal discharge, vaginal pain, hematuria.    LMP: No LMP recorded (lmp unknown). Patient is postmenopausal.  ROS: As in HPI.  All other pertinent ROS negative.     Past Medical History:  Diagnosis Date  . Atypical nevus 10/29/2001   CENTER MID BACK( SLIGHT)- NO TX CLEAR  . Atypical nevus 06/07/2008   RIGHT THIGH ( MARKED) -TX WIDER SHAVE  . Osteopenia   . SCCA (squamous cell carcinoma) of skin 07/08/2000   UPPER RIGHT FOREHEAD- TX CURET X 3, 5FU   Past Surgical History:  Procedure Laterality Date  . BREAST SURGERY     fibroids removed as a teen  . DILATION AND CURETTAGE OF UTERUS    . EXTERNAL AUDITORY CANAL RECONSTRUCTION Bilateral   . WISDOM TOOTH EXTRACTION     No Known Allergies No current facility-administered medications on file prior to encounter.   No current outpatient medications on file prior to encounter.   Social History   Socioeconomic History  . Marital status: Married    Spouse name: Not on file  . Number of children: Not on file  . Years of education: Not on file  . Highest education level: Not on file  Occupational History  . Not on file  Tobacco Use  . Smoking status: Never Smoker  . Smokeless tobacco: Never Used  Vaping Use  . Vaping Use: Never used  Substance and Sexual Activity  . Alcohol use: No  . Drug use: No  . Sexual activity: Yes    Partners: Male    Birth control/protection: Post-menopausal    Other Topics Concern  . Not on file  Social History Narrative  . Not on file   Social Determinants of Health   Financial Resource Strain:   . Difficulty of Paying Living Expenses:   Food Insecurity:   . Worried About Charity fundraiser in the Last Year:   . Arboriculturist in the Last Year:   Transportation Needs:   . Film/video editor (Medical):   Marland Kitchen Lack of Transportation (Non-Medical):   Physical Activity:   . Days of Exercise per Week:   . Minutes of Exercise per Session:   Stress:   . Feeling of Stress :   Social Connections:   . Frequency of Communication with Friends and Family:   . Frequency of Social Gatherings with Friends and Family:   . Attends Religious Services:   . Active Member of Clubs or Organizations:   . Attends Archivist Meetings:   Marland Kitchen Marital Status:   Intimate Partner Violence:   . Fear of Current or Ex-Partner:   . Emotionally Abused:   Marland Kitchen Physically Abused:   . Sexually Abused:    Family History  Problem Relation Age of Onset  . Thyroid disease Mother   . Depression Mother   . Breast cancer Mother 41  . Anxiety disorder Mother   . Cancer Father   . Lung cancer Father   .  Thyroid disease Sister   . Colon cancer Maternal Aunt        late 30's  . Colon cancer Maternal Uncle        late 14's  . Breast cancer Cousin        late 61's  . Breast cancer Cousin        late 64's  . Breast cancer Cousin        late 72's    OBJECTIVE:  Vitals:   11/19/19 0816  BP: 122/84  Pulse: 76  Resp: 18  Temp: 98.1 F (36.7 C)  SpO2: 98%   General appearance: Alert in no acute distress HEENT: NCAT.  Oropharynx clear.  Lungs: clear to auscultation bilaterally without adventitious breath sounds Heart: regular rate and rhythm.  Abdomen: soft; non-distended; no tenderness; bowel sounds present; no guarding or rebound tenderness Back: no CVA tenderness Extremities: no edema; symmetrical with no gross deformities Skin: warm and  dry Neurologic: Ambulates from chair to exam table without difficulty Psychological: alert and cooperative; normal mood and affect  Labs Reviewed  POCT URINALYSIS DIP (MANUAL ENTRY) - Abnormal; Notable for the following components:      Result Value   Blood, UA large (*)    Leukocytes, UA Moderate (2+) (*)    All other components within normal limits  URINE CULTURE    ASSESSMENT & PLAN:  1. Dysuria   2. Abnormal urine odor     Meds ordered this encounter  Medications  . nitrofurantoin, macrocrystal-monohydrate, (MACROBID) 100 MG capsule    Sig: Take 1 capsule (100 mg total) by mouth 2 (two) times daily.    Dispense:  10 capsule    Refill:  0    Order Specific Question:   Supervising Provider    Answer:   Raylene Everts [[id number]]  . phenazopyridine (PYRIDIUM) 200 MG tablet    Sig: Take 1 tablet (200 mg total) by mouth 3 (three) times daily.    Dispense:  6 tablet    Refill:  0    Order Specific Question:   Supervising Provider    Answer:   Raylene Everts [[id number]]    Urine culture sent.  We will call you with the results.   Push fluids and get plenty of rest.   Take antibiotic as directed and to completion Take pyridium as prescribed and as needed for symptomatic relief Follow up with PCP if symptoms persists Return here or go to ER if you have any new or worsening symptoms such as fever, worsening abdominal pain, nausea/vomiting, flank pain, etc...  Outlined signs and symptoms indicating need for more acute intervention. Patient verbalized understanding. After Visit Summary given.     Stacey Drain Whittingham, PA-C 11/19/19 [phone]

## 2019-11-22 LAB — URINE CULTURE: Culture: >=100000 — AB

## 2019-11-26 ENCOUNTER — Ambulatory Visit: Payer: BC Managed Care – PPO | Admitting: Obstetrics and Gynecology

## 2019-11-26 ENCOUNTER — Other Ambulatory Visit: Payer: Self-pay

## 2019-11-26 ENCOUNTER — Encounter: Payer: Self-pay | Admitting: Obstetrics and Gynecology

## 2019-11-26 ENCOUNTER — Telehealth: Payer: Self-pay | Admitting: Obstetrics and Gynecology

## 2019-11-26 VITALS — HR 88 | Temp 98.3°F | Ht 67.5 in | Wt 127.0 lb

## 2019-11-26 DIAGNOSIS — N762 Acute vulvitis: Secondary | ICD-10-CM

## 2019-11-26 DIAGNOSIS — N3091 Cystitis, unspecified with hematuria: Secondary | ICD-10-CM

## 2019-11-26 DIAGNOSIS — R300 Dysuria: Secondary | ICD-10-CM | POA: Diagnosis not present

## 2019-11-26 LAB — POCT URINALYSIS DIPSTICK
Bilirubin, UA: NEGATIVE
Glucose, UA: NEGATIVE
Ketones, UA: NEGATIVE
Nitrite, UA: NEGATIVE
Protein, UA: POSITIVE — AB
Urobilinogen, UA: NEGATIVE E.U./dL — AB
pH, UA: 6 (ref 5.0–8.0)

## 2019-11-26 MED ORDER — SULFAMETHOXAZOLE-TRIMETHOPRIM 800-160 MG PO TABS
1.0000 | ORAL_TABLET | Freq: Two times a day (BID) | ORAL | 0 refills | Status: DC
Start: 2019-11-26 — End: 2020-03-30

## 2019-11-26 MED ORDER — PHENAZOPYRIDINE HCL 200 MG PO TABS: 200.0000 mg | ORAL_TABLET | Freq: Three times a day (TID) | ORAL | 0 refills | Status: DC | PRN

## 2019-11-26 NOTE — Telephone Encounter (Signed)
Spoke with patient. Patient reports she was treated for a UTI at Urgent Care on 11/19/2019 with Macrobid. Culture showed E Coli. Results in Epic. Patient reports she completed course of antibiotics. Patient is having urinary urgency, abdominal cramping, and visible blood in urine. Denies fever, chills, or back pain. Patient is five minutes from the office. Advised to come now for work in appointment with Dr.Jertson. Patient verbalizes understanding.  Routing to provider and will close encounter.

## 2019-11-26 NOTE — Patient Instructions (Signed)

## 2019-11-26 NOTE — Telephone Encounter (Signed)
Patient has "blood in her urine". She asked that you return her call at [phone].

## 2019-11-26 NOTE — Progress Notes (Signed)
GYNECOLOGY  VISIT   HPI: 49 y.o.   Married White or Caucasian Not Hispanic or Latino  female   [id number] with No LMP recorded (lmp unknown). Patient is postmenopausal.   here for 11/19/18 was dx with e.coli UTI at urgent care. She took all of the Macrobid and didn't seem to have any symptoms for about 2 days. Then today she has visible blood in her urine. She just started to have urinary urgency and mild lower abdominal discomfort. The discomfort feels like menstrual cramps. She started bleeding with urination mid day today, definitely coming from her bladder. Slight exterior vaginal discomfort. Slight vaginal itching, no vaginal discharge.  GYNECOLOGIC HISTORY: No LMP recorded (lmp unknown). Patient is postmenopausal. Contraception:PMP Menopausal hormone therapy: none        OB History    Gravida  0   Para  0   Term  0   Preterm  0   AB  0   Living  0     SAB  0   TAB  0   Ectopic  0   Multiple  0   Live Births  0              Patient Active Problem List   Diagnosis Date Noted  . Abnormal uterine bleeding 10/29/2019  . Bacterial vaginosis 10/29/2019  . Breast lump 10/29/2019  . Menopausal symptom 10/29/2019  . Sensorineural hearing loss (SNHL), bilateral 03/20/2018  . Osteopenia     Past Medical History:  Diagnosis Date  . Atypical nevus 10/29/2001   CENTER MID BACK( SLIGHT)- NO TX CLEAR  . Atypical nevus 06/07/2008   RIGHT THIGH ( MARKED) -TX WIDER SHAVE  . Osteopenia   . SCCA (squamous cell carcinoma) of skin 07/08/2000   UPPER RIGHT FOREHEAD- TX CURET X 3, 5FU    Past Surgical History:  Procedure Laterality Date  . BREAST SURGERY     fibroids removed as a teen  . DILATION AND CURETTAGE OF UTERUS    . EXTERNAL AUDITORY CANAL RECONSTRUCTION Bilateral   . WISDOM TOOTH EXTRACTION      No current outpatient medications on file.   No current facility-administered medications for this visit.     ALLERGIES: Patient has no known allergies.  Family  History  Problem Relation Age of Onset  . Thyroid disease Mother   . Depression Mother   . Breast cancer Mother 84  . Anxiety disorder Mother   . Cancer Father   . Lung cancer Father   . Thyroid disease Sister   . Colon cancer Maternal Aunt        late 65's  . Colon cancer Maternal Uncle        late 32's  . Breast cancer Cousin        late 75's  . Breast cancer Cousin        late 66's  . Breast cancer Cousin        late 75's    Social History   Socioeconomic History  . Marital status: Married    Spouse name: Not on file  . Number of children: Not on file  . Years of education: Not on file  . Highest education level: Not on file  Occupational History  . Not on file  Tobacco Use  . Smoking status: Never Smoker  . Smokeless tobacco: Never Used  Vaping Use  . Vaping Use: Never used  Substance and Sexual Activity  . Alcohol use: No  . Drug use:  No  . Sexual activity: Yes    Partners: Male    Birth control/protection: Post-menopausal  Other Topics Concern  . Not on file  Social History Narrative  . Not on file   Social Determinants of Health   Financial Resource Strain:   . Difficulty of Paying Living Expenses:   Food Insecurity:   . Worried About Charity fundraiser in the Last Year:   . Arboriculturist in the Last Year:   Transportation Needs:   . Film/video editor (Medical):   Marland Kitchen Lack of Transportation (Non-Medical):   Physical Activity:   . Days of Exercise per Week:   . Minutes of Exercise per Session:   Stress:   . Feeling of Stress :   Social Connections:   . Frequency of Communication with Friends and Family:   . Frequency of Social Gatherings with Friends and Family:   . Attends Religious Services:   . Active Member of Clubs or Organizations:   . Attends Archivist Meetings:   Marland Kitchen Marital Status:   Intimate Partner Violence:   . Fear of Current or Ex-Partner:   . Emotionally Abused:   Marland Kitchen Physically Abused:   . Sexually Abused:      Review of Systems  Gastrointestinal: Positive for abdominal pain.  Genitourinary: Positive for dysuria, frequency, hematuria and urgency.    PHYSICAL EXAMINATION:    Pulse 88   Temp 98.3 F (36.8 C)   Ht 5' 7.5" (1.715 m)   Wt 127 lb (57.6 kg)   LMP  (LMP Unknown)   SpO2 99%   BMI 19.60 kg/m     General appearance: alert, cooperative and appears stated age Abdomen: soft, non-tender; non distended, no masses,  no organomegaly CVA: not tender  Pelvic: External genitalia:  no lesions              Urethra:  normal appearing urethra with no masses, tenderness or lesions              Bartholins and Skenes: normal                 Vagina: atrophic appearing vagina with normal color and discharge, no lesions              Cervix: no lesions              Bimanual Exam:  Uterus:  normal size, contour, position, consistency, mobility, non-tender              Adnexa: no mass, fullness, tenderness              Chaperone was present for exam.  Urine dip: 3+ leuk, 3+ blood.   ASSESSMENT Cystitis with hematuria, just treated for UTI last week Mild vulvitis symptoms    PLAN Send urine for ua, c&s Treat with bactrim ds and pyridium Nuswab vaginitis panel Call with fever or flank pain

## 2019-11-27 LAB — URINALYSIS, MICROSCOPIC ONLY
Casts: NONE SEEN /lpf
RBC, Urine: >30 /hpf — AB (ref 0–2)
WBC, UA: >30 /hpf — AB (ref 0–5)

## 2019-11-28 LAB — URINE CULTURE

## 2019-11-28 LAB — NUSWAB BV AND CANDIDA, NAA
Candida albicans, NAA: NEGATIVE
Candida glabrata, NAA: NEGATIVE

## 2020-02-18 ENCOUNTER — Encounter: Payer: Self-pay | Admitting: Gastroenterology

## 2020-03-30 ENCOUNTER — Encounter: Payer: Self-pay | Admitting: Gastroenterology

## 2020-03-30 ENCOUNTER — Other Ambulatory Visit: Payer: Self-pay

## 2020-03-30 ENCOUNTER — Ambulatory Visit (AMBULATORY_SURGERY_CENTER): Payer: Self-pay | Admitting: *Deleted

## 2020-03-30 VITALS — Ht 67.5 in | Wt 130.0 lb

## 2020-03-30 DIAGNOSIS — Z1211 Encounter for screening for malignant neoplasm of colon: Secondary | ICD-10-CM

## 2020-03-30 DIAGNOSIS — Z01818 Encounter for other preprocedural examination: Secondary | ICD-10-CM

## 2020-03-30 MED ORDER — SUTAB 1479-225-188 MG PO TABS: 1.0000 | ORAL_TABLET | ORAL | 0 refills | Status: DC

## 2020-03-30 NOTE — Progress Notes (Signed)
Patient is here in-person for PV. Patient denies any allergies to eggs or soy. Patient denies any problems with anesthesia/sedation. Patient denies any oxygen use at home. Patient denies taking any diet/weight loss medications or blood thinners. Patient is not being treated for MRSA or C-diff. Patient is aware of our care-partner policy and [id number] safety protocol. EMMI education assigned to the patient for the procedure, sent to [facility].   COVID-19 test 11/16 at 930 am pt is aware. Prep Prescription coupon given to the patient.

## 2020-04-05 ENCOUNTER — Other Ambulatory Visit: Payer: Self-pay | Admitting: Gastroenterology

## 2020-04-05 LAB — SARS CORONAVIRUS 2 (TAT 6-24 HRS): SARS Coronavirus 2: NEGATIVE

## 2020-04-07 ENCOUNTER — Ambulatory Visit (AMBULATORY_SURGERY_CENTER): Payer: BC Managed Care – PPO | Admitting: Gastroenterology

## 2020-04-07 ENCOUNTER — Other Ambulatory Visit: Payer: Self-pay

## 2020-04-07 ENCOUNTER — Encounter: Payer: Self-pay | Admitting: Gastroenterology

## 2020-04-07 VITALS — BP 104/68 | HR 65 | Temp 96.9°F | Resp 15 | Ht 67.0 in | Wt 130.0 lb

## 2020-04-07 DIAGNOSIS — D124 Benign neoplasm of descending colon: Secondary | ICD-10-CM

## 2020-04-07 DIAGNOSIS — Z1211 Encounter for screening for malignant neoplasm of colon: Secondary | ICD-10-CM | POA: Diagnosis present

## 2020-04-07 MED ORDER — SODIUM CHLORIDE 0.9 % IV SOLN: 500.0000 mL | Freq: Once | INTRAVENOUS | Status: DC

## 2020-04-07 NOTE — Progress Notes (Signed)
Called to room to assist during endoscopic procedure.  Patient ID and intended procedure confirmed with present staff. Received instructions for my participation in the procedure from the performing physician.  

## 2020-04-07 NOTE — Patient Instructions (Signed)
HANDOUTS PROVIDED ON: Polyps  The polyps removed today have been sent for pathology.  The results can take 1-3 weeks to receive.  When your next colonoscopy should occur will be based on the pathology results.    You may resume your previous diet and medication schedule.  Thank you for allowing [place] to care for you today!!!   YOU HAD AN ENDOSCOPIC PROCEDURE TODAY AT [facility]:   Refer to the procedure report that was given to you for any specific questions about what was found during the examination.  If the procedure report does not answer your questions, please call your gastroenterologist to clarify.  If you requested that your care partner not be given the details of your procedure findings, then the procedure report has been included in a sealed envelope for you to review at your convenience later.  YOU SHOULD EXPECT: Some feelings of bloating in the abdomen. Passage of more gas than usual.  Walking can help get rid of the air that was put into your GI tract during the procedure and reduce the bloating. If you had a lower endoscopy (such as a colonoscopy or flexible sigmoidoscopy) you may notice spotting of blood in your stool or on the toilet paper. If you underwent a bowel prep for your procedure, you may not have a normal bowel movement for a few days.  Please Note:  You might notice some irritation and congestion in your nose or some drainage.  This is from the oxygen used during your procedure.  There is no need for concern and it should clear up in a day or so.  SYMPTOMS TO REPORT IMMEDIATELY:   Following lower endoscopy (colonoscopy or flexible sigmoidoscopy):  Excessive amounts of blood in the stool  Significant tenderness or worsening of abdominal pains  Swelling of the abdomen that is new, acute  Fever of 100F or higher   For urgent or emergent issues, a gastroenterologist can be reached at any hour by calling [phone]. Do not use MyChart messaging for  urgent concerns.    DIET:  We do recommend a small meal at first, but then you may proceed to your regular diet.  Drink plenty of fluids but you should avoid alcoholic beverages for 24 hours.  ACTIVITY:  You should plan to take it easy for the rest of today and you should NOT DRIVE or use heavy machinery until tomorrow (because of the sedation medicines used during the test).    FOLLOW UP: Our staff will call the number listed on your records 48-72 hours following your procedure to check on you and address any questions or concerns that you may have regarding the information given to you following your procedure. If we do not reach you, we will leave a message.  We will attempt to reach you two times.  During this call, we will ask if you have developed any symptoms of COVID 19. If you develop any symptoms (ie: fever, flu-like symptoms, shortness of breath, cough etc.) before then, please call [phone].  If you test positive for Covid 19 in the 2 weeks post procedure, please call and report this information to [place].    If any biopsies were taken you will be contacted by phone or by letter within the next 1-3 weeks.  Please call us at [phone] if you have not heard about the biopsies in 3 weeks.    SIGNATURES/CONFIDENTIALITY: You and/or your care partner have signed paperwork which will be entered  into your electronic medical record.  These signatures attest to the fact that that the information above on your After Visit Summary has been reviewed and is understood.  Full responsibility of the confidentiality of this discharge information lies with you and/or your care-partner.

## 2020-04-07 NOTE — Op Note (Signed)
[facility] Patient Name: Maria Hayes Procedure Date: 04/07/2020 1:07 PM MRN: [id number] Endoscopist: Remo Lipps P. Havery Moros , MD Age: 49 Referring MD:  Date of Birth: [birth year] Gender: Female Account #: [id number] Procedure:                Colonoscopy Indications:              Screening for colorectal malignant neoplasm, This                            is the patient's first colonoscopy Medicines:                Monitored Anesthesia Care Procedure:                Pre-Anesthesia Assessment:                           - Prior to the procedure, a History and Physical                            was performed, and patient medications and                            allergies were reviewed. The patient's tolerance of                            previous anesthesia was also reviewed. The risks                            and benefits of the procedure and the sedation                            options and risks were discussed with the patient.                            All questions were answered, and informed consent                            was obtained. Prior Anticoagulants: The patient has                            taken no previous anticoagulant or antiplatelet                            agents. ASA Grade Assessment: II - A patient with                            mild systemic disease. After reviewing the risks                            and benefits, the patient was deemed in                            satisfactory condition to undergo the procedure.  After obtaining informed consent, the colonoscope                            was passed under direct vision. Throughout the                            procedure, the patient's blood pressure, pulse, and                            oxygen saturations were monitored continuously. The                            Colonoscope was introduced through the anus and                            advanced to the the  cecum, identified by                            appendiceal orifice and ileocecal valve. The                            colonoscopy was performed without difficulty. The                            patient tolerated the procedure well. The quality                            of the bowel preparation was good. The ileocecal                            valve, appendiceal orifice, and rectum were                            photographed. Scope In: 1:33:39 PM Scope Out: 1:55:02 PM Scope Withdrawal Time: 0 hours 17 minutes 46 seconds  Total Procedure Duration: 0 hours 21 minutes 23 seconds  Findings:                 The perianal and digital rectal examinations were                            normal.                           A 4 mm polyp was found in the descending colon. The                            polyp was sessile. The polyp was removed with a                            cold snare. Resection and retrieval were complete.                           The colon was tortuous.  The exam was otherwise without abnormality. Complications:            No immediate complications. Estimated blood loss:                            Minimal. Estimated Blood Loss:     Estimated blood loss was minimal. Impression:               - One 4 mm polyp in the descending colon, removed                            with a cold snare. Resected and retrieved.                           - Tortuous colon.                           - The examination was otherwise normal. Recommendation:           - Patient has a contact number available for                            emergencies. The signs and symptoms of potential                            delayed complications were discussed with the                            patient. Return to normal activities tomorrow.                            Written discharge instructions were provided to the                            patient.                           -  Resume previous diet.                           - Continue present medications.                           - Await pathology results. Remo Lipps P. Wana Mount, MD 04/07/2020 1:58:09 PM This report has been signed electronically.

## 2020-04-07 NOTE — Progress Notes (Signed)
A and O x3. Report to RN. Tolerated MAC anesthesia well.

## 2020-04-07 NOTE — Progress Notes (Signed)
VS- Maria Hayes  Pt's states no medical or surgical changes since previsit or office visit.  

## 2020-04-11 ENCOUNTER — Telehealth: Payer: Self-pay | Admitting: *Deleted

## 2020-04-11 NOTE — Telephone Encounter (Signed)
  Follow up Call-  Call back number 04/07/2020  Post procedure Call Back phone  # [phone]  Permission to leave phone message Yes  Some recent data might be hidden     Patient questions:  Do you have a fever, pain , or abdominal swelling? No. Pain Score  0 *  Have you tolerated food without any problems? Yes.    Have you been able to return to your normal activities? Yes.    Do you have any questions about your discharge instructions: Diet   No. Medications  No. Follow up visit  No.  Do you have questions or concerns about your Care? No.  Actions: * If pain score is 4 or above: No action needed, pain <4.  1. Have you developed a fever since your procedure? no  2.   Have you had an respiratory symptoms (SOB or cough) since your procedure? no  3.   Have you tested positive for COVID 19 since your procedure no  4.   Have you had any family members/close contacts diagnosed with the COVID 19 since your procedure? no   If yes to any of these questions please route to Joylene John, RN and Joella Prince, RN

## 2020-05-16 ENCOUNTER — Encounter: Payer: Self-pay | Admitting: Dermatology

## 2020-05-16 ENCOUNTER — Ambulatory Visit: Payer: BC Managed Care – PPO | Admitting: Dermatology

## 2020-05-16 ENCOUNTER — Other Ambulatory Visit: Payer: Self-pay

## 2020-05-16 DIAGNOSIS — Z86018 Personal history of other benign neoplasm: Secondary | ICD-10-CM

## 2020-05-16 DIAGNOSIS — D2371 Other benign neoplasm of skin of right lower limb, including hip: Secondary | ICD-10-CM | POA: Diagnosis not present

## 2020-05-16 DIAGNOSIS — D1801 Hemangioma of skin and subcutaneous tissue: Secondary | ICD-10-CM

## 2020-05-16 DIAGNOSIS — L820 Inflamed seborrheic keratosis: Secondary | ICD-10-CM | POA: Diagnosis not present

## 2020-05-16 DIAGNOSIS — Z1283 Encounter for screening for malignant neoplasm of skin: Secondary | ICD-10-CM

## 2020-05-16 DIAGNOSIS — D239 Other benign neoplasm of skin, unspecified: Secondary | ICD-10-CM

## 2020-05-16 DIAGNOSIS — Z86006 Personal history of melanoma in-situ: Secondary | ICD-10-CM | POA: Diagnosis not present

## 2020-05-16 DIAGNOSIS — Z86007 Personal history of in-situ neoplasm of skin: Secondary | ICD-10-CM | POA: Diagnosis not present

## 2020-05-16 DIAGNOSIS — D485 Neoplasm of uncertain behavior of skin: Secondary | ICD-10-CM

## 2020-05-16 DIAGNOSIS — L821 Other seborrheic keratosis: Secondary | ICD-10-CM

## 2020-05-19 ENCOUNTER — Encounter: Payer: Self-pay | Admitting: Dermatology

## 2020-05-19 NOTE — Progress Notes (Signed)
Follow-Up Visit   Subjective  Maria Hayes is a 49 y.o. female who presents for the following: Annual Exam (Patient here today for a skin check.  Check spot on right ankle x unsure no bleeding, it's getting larger.).  New growth Location: Right ankle Duration:  Quality:  Associated Signs/Symptoms: Modifying Factors:  Severity:  Timing: Context: History of MIS left leg.  Also several keratoses causing local discomfort.  Objective  Well appearing patient in no apparent distress; mood and affect are within normal limits. Objective  Head - Anterior (Face): Head to toe exam done today. No signs of atypical moles, melanoma or non mole skin cancer.  Objective  Right Ankle - Anterior, Right Lower Leg - Posterior: 5 mm firm dermal pink papule  Objective  Left Upper Thigh: Scar clear with perfect contour and aesthetically acceptable.  Objective  Center Mid Back, Right Thigh - Anterior: No recurrence  Objective  Mid Back, Posterior Mid Neck, Right Lower Back, Right Supraclavicular Area, Right Upper Back: Stuck-on, waxy, tan-brown papules and plaques. --Discussed benign etiology and prognosis.   Objective  Right Inner Ankle: Pearly 97mm papule; dermoscopy amorphous.  Patient warned before biopsy that this is a challenging place to heal and may leave unattractive scar.       Objective  Neck - Anterior, Neck - Posterior: Multiple inflamed 3 to 5 mm seborrheic keratoses which cause itching and bother patient    A full examination was performed including scalp, head, eyes, ears, nose, lips, neck, chest, axillae, abdomen, back, buttocks, bilateral upper extremities, bilateral lower extremities, hands, feet, fingers, toes, fingernails, and toenails. All findings within normal limits unless otherwise noted below.   Assessment & Plan    Skin exam for malignant neoplasm Head - Anterior (Face)  Yearly skin check.  Dermatofibroma (2) Right Ankle - Anterior; Right Lower  Leg - Posterior  Benign okay to leave unless patient wants removed or clinically changes  History of melanoma in situ Left Upper Thigh  Yearly skin check; encouraged to examine her own skin twice annually  History of atypical skin mole (2) Right Thigh - Anterior; Center Mid Back  Annual dermatologist examination  History of squamous cell carcinoma in situ (SCCIS) of skin Right Forehead  Seborrheic keratosis (5) Right Upper Back; Right Lower Back; Mid Back; Posterior Mid Neck; Right Supraclavicular Area  Benign okay to leave unless patient wants removed.  Neoplasm of uncertain behavior of skin Right Inner Ankle  Skin / nail biopsy Type of biopsy: tangential   Informed consent: discussed and consent obtained   Timeout: patient name, date of birth, surgical site, and procedure verified   Procedure prep:  Patient was prepped and draped in usual sterile fashion (Non sterile) Prep type:  Chlorhexidine Anesthesia: the lesion was anesthetized in a standard fashion   Anesthetic:  1% lidocaine w/ epinephrine 1-100,000 local infiltration Instrument used: flexible razor blade   Hemostasis achieved with: ferric subsulfate and electrodesiccation   Outcome: patient tolerated procedure well   Post-procedure details: sterile dressing applied and wound care instructions given   Dressing type: petrolatum    Specimen 1 - Surgical pathology Differential Diagnosis: R/O DF  Check Margins: No  Cautery after biopsy  Hemangioma of skin Left Popliteal Fossa  Inflamed seborrheic keratosis (2) Neck - Anterior; Neck - Posterior  Destruction of lesion - Neck - Anterior, Neck - Posterior Complexity: simple   Destruction method: cryotherapy   Informed consent: discussed and consent obtained   Lesion destroyed using liquid nitrogen:  Yes   Cryotherapy cycles:  5 Outcome: patient tolerated procedure well with no complications        I, Janalyn Harder, MD, have reviewed all documentation  for this visit.  The documentation on 05/19/20 for the exam, diagnosis, procedures, and orders are all accurate and complete.

## 2020-07-04 ENCOUNTER — Ambulatory Visit: Payer: BC Managed Care – PPO | Admitting: Dermatology

## 2020-09-15 ENCOUNTER — Ambulatory Visit
Admission: RE | Admit: 2020-09-15 | Discharge: 2020-09-15 | Disposition: A | Discharge status: Home / Self Care | Payer: 59 | Source: Ambulatory Visit | Attending: Family Medicine | Admitting: Family Medicine

## 2020-09-15 ENCOUNTER — Other Ambulatory Visit: Payer: Self-pay

## 2020-09-15 VITALS — BP 123/83 | HR 68 | Temp 98.3°F | Resp 18

## 2020-09-15 DIAGNOSIS — N3000 Acute cystitis without hematuria: Secondary | ICD-10-CM | POA: Diagnosis not present

## 2020-09-15 LAB — POCT URINALYSIS DIP (MANUAL ENTRY)
Bilirubin, UA: NEGATIVE
Glucose, UA: NEGATIVE mg/dL
Ketones, POC UA: NEGATIVE mg/dL
Nitrite, UA: NEGATIVE
Protein Ur, POC: NEGATIVE mg/dL
Spec Grav, UA: 1.015 (ref 1.010–1.025)
Urobilinogen, UA: 0.2 E.U./dL
pH, UA: 7 (ref 5.0–8.0)

## 2020-09-15 MED ORDER — SULFAMETHOXAZOLE-TRIMETHOPRIM 800-160 MG PO TABS: 1.0000 | ORAL_TABLET | Freq: Two times a day (BID) | ORAL | 0 refills | Status: AC

## 2020-09-15 NOTE — ED Triage Notes (Signed)
Burning, frequency and lower abd pain x 1 week.

## 2020-09-15 NOTE — ED Provider Notes (Signed)
[facility]    CSN: [id number] Arrival date & time: 09/15/20  1054      History   Chief Complaint No chief complaint on file.   HPI Maria Hayes is a 50 y.o. female.   HPI Maria Hayes is a 50 y.o. female presents for evaluation of urinary frequency, urgency and dysuria x 7 days, without flank pain, fever, chills, or abnormal vaginal discharge or bleeding. Previous history of UTI EColi grew per last urine pathology. Treated successfully with bactrim. Denies N&V and fever. Past Medical History:  Diagnosis Date  . Atypical nevus 10/29/2001   CENTER MID BACK( SLIGHT)- NO TX CLEAR  . Atypical nevus 06/07/2008   RIGHT THIGH ( MARKED) -TX WIDER SHAVE  . Osteopenia   . SCCA (squamous cell carcinoma) of skin 07/08/2000   UPPER RIGHT FOREHEAD- TX CURET X 3, 5FU    Patient Active Problem List   Diagnosis Date Noted  . Abnormal uterine bleeding 10/29/2019  . Bacterial vaginosis 10/29/2019  . Breast lump 10/29/2019  . Menopausal symptom 10/29/2019  . Sensorineural hearing loss (SNHL), bilateral 03/20/2018  . Osteopenia     Past Surgical History:  Procedure Laterality Date  . BREAST SURGERY     fibroids removed as a teen  . DILATION AND CURETTAGE OF UTERUS    . EXTERNAL AUDITORY CANAL RECONSTRUCTION Bilateral   . WISDOM TOOTH EXTRACTION      OB History    Gravida  0   Para  0   Term  0   Preterm  0   AB  0   Living  0     SAB  0   IAB  0   Ectopic  0   Multiple  0   Live Births  0            Home Medications    Prior to Admission medications   Medication Sig Start Date End Date Taking? Authorizing Provider  sulfamethoxazole-trimethoprim (BACTRIM DS) 800-160 MG tablet Take 1 tablet by mouth 2 (two) times daily for 7 days. 09/15/20 09/22/20 Yes Scot Jun, FNP  Ascorbic Acid (VITAMIN C PO) Take by mouth.    [provider]  Calcium Carb-Cholecalciferol (CALCIUM + D3 PO) Take by mouth.    [provider]   Multiple Vitamins-Minerals (ONE-A-DAY WOMENS PO) Take by mouth.    [provider]  zinc gluconate 50 MG tablet Take 50 mg by mouth daily.    [provider]    Family History Family History  Problem Relation Age of Onset  . Thyroid disease Mother   . Depression Mother   . Breast cancer Mother 53  . Anxiety disorder Mother   . Cancer Father   . Lung cancer Father   . Thyroid disease Sister   . Colon cancer Maternal Aunt        late 56's  . Colon cancer Maternal Uncle        late 27's  . Breast cancer Cousin        late 35's  . Breast cancer Cousin        late 51's  . Breast cancer Cousin        late 64's  . Colon polyps Neg Hx   . Esophageal cancer Neg Hx   . Rectal cancer Neg Hx   . Stomach cancer Neg Hx     Social History Social History   Tobacco Use  . Smoking status: Never Smoker  .  Smokeless tobacco: Never Used  Vaping Use  . Vaping Use: Never used  Substance Use Topics  . Alcohol use: No  . Drug use: No     Allergies   Patient has no known allergies.   Review of Systems Review of Systems Pertinent negatives listed in HPI   Physical Exam Triage Vital Signs ED Triage Vitals  Enc Vitals Group     BP 09/15/20 1108 123/83     Pulse Rate 09/15/20 1108 68     Resp 09/15/20 1108 18     Temp 09/15/20 1108 98.3 F (36.8 C)     Temp Source 09/15/20 1108 Oral     SpO2 09/15/20 1108 98 %     Weight --      Height --      Head Circumference --      Peak Flow --      Pain Score 09/15/20 1106 0     Pain Loc --      Pain Edu? --      Excl. in [place]? --    No data found.  Updated Vital Signs BP 123/83 (BP Location: Right Arm)   Pulse 68   Temp 98.3 F (36.8 C) (Oral)   Resp 18   LMP  (LMP Unknown)   SpO2 98%   Visual Acuity Right Eye Distance:   Left Eye Distance:   Bilateral Distance:    Right Eye Near:   Left Eye Near:    Bilateral Near:     Physical Exam General appearance: alert, well developed, well nourished,  cooperative  Head: Normocephalic, without obvious abnormality, atraumatic Respiratory: Respirations even and unlabored, normal respiratory rate Heart: rate and rhythm normal. No gallop or murmurs noted on exam  Abdomen: BS +, no distention, no rebound tenderness,no flank pain Extremities: No gross deformities Skin: Skin color, texture, turgor normal. No rashes seen  Psych: Appropriate mood and affect. Neurologic: GCS 15, normal coordination, normal gait UC Treatments / Results  Labs (all labs ordered are listed, but only abnormal results are displayed) Labs Reviewed  POCT URINALYSIS DIP (MANUAL ENTRY) - Abnormal; Notable for the following components:      Result Value   Color, UA light yellow (*)    Clarity, UA cloudy (*)    Blood, UA moderate (*)    Leukocytes, UA Small (1+) (*)    All other components within normal limits  URINE CULTURE    EKG   Radiology No results found.  Procedures Procedures (including critical care time)  Medications Ordered in UC Medications - No data to display  Initial Impression / Assessment and Plan / UC Course  I have reviewed the triage vital signs and the nursing notes.  Pertinent labs & imaging results that were available during my care of the patient were reviewed by me and considered in my medical decision making (see chart for details).      UA abnormal and findings consistent with UTI. Empiric antibiotic treatment initiated.  Encouraged increase intake of water. Recommended cranberry tablets, wiping from front to back, and urinating prior to and after cortis to reduce risk for reinfection. Urine culture pending.  ER if symptoms become severe. Follow-up with PCP if symptoms do not completely resolve. Final Clinical Impressions(s) / UC Diagnoses   Final diagnoses:  Acute cystitis without hematuria   Discharge Instructions   None    ED Prescriptions    Medication Sig Dispense Auth. Provider   sulfamethoxazole-trimethoprim  (BACTRIM DS) 800-160 MG tablet  Take 1 tablet by mouth 2 (two) times daily for 7 days. 14 tablet Scot Jun, FNP     PDMP not reviewed this encounter.   Scot Jun, FNP 09/15/20 1139

## 2020-09-18 LAB — URINE CULTURE: Culture: 80000 — AB

## 2020-11-03 ENCOUNTER — Ambulatory Visit: Payer: BC Managed Care – PPO | Admitting: Obstetrics and Gynecology

## 2020-12-19 NOTE — Progress Notes (Signed)
50 y.o. G0P0000 Married White or Caucasian Not Hispanic or Latino female here for annual exam.  PMP, no bleeding. Sexually active, no pain.   She has vasomotor symptoms, tolerable. Asking about HRT and joint pain. She is getting some arthritis  Patient states that she had had 3 UTI's this year. 3/22, 4/22 and 7/22. Two treated via e-visits, the one in 4/22 grew out klebsiella pneumoniae. Not clear to her that the infections are related to intercourse.  She is drinking lots of water, taking cranberry pills.  No other bladder c/o. Normal BM's.     No LMP recorded (lmp unknown). Patient is postmenopausal.          Sexually active: Yes.    The current method of family planning is post menopausal status.    Exercising: Yes.     Cardio resistence Smoker:  no  Health Maintenance: Pap:  08/28/2017 WNL NEG HPV, 04/08/14 WNL, NEG HPV History of abnormal Pap:  no MMG: does thermography now 10/17/20 normal per patient. A view to your health.   BMD:   06/03/13 osteopenia  Colonoscopy: 04/07/20 1 polyp was removed, f/u in 7 years TDaP:  utd with primary  Gardasil: n/a   reports that she has never smoked. She has never used smokeless tobacco. She reports that she does not drink alcohol and does not use drugs. 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   Past Medical History:  Diagnosis Date   Atypical nevus 10/29/2001   CENTER MID BACK( SLIGHT)- NO TX CLEAR   Atypical nevus 06/07/2008   RIGHT THIGH ( MARKED) -TX WIDER SHAVE   Osteopenia    SCCA (squamous cell carcinoma) of skin 07/08/2000   UPPER RIGHT FOREHEAD- TX CURET X 3, 5FU    Past Surgical History:  Procedure Laterality Date   BREAST SURGERY     fibroids removed as a teen   DILATION AND CURETTAGE OF UTERUS     EXTERNAL AUDITORY CANAL RECONSTRUCTION Bilateral    WISDOM TOOTH EXTRACTION      Current Outpatient Medications  Medication Sig Dispense Refill   Ascorbic Acid (VITAMIN C PO) Take by mouth.     Calcium  Carb-Cholecalciferol (CALCIUM + D3 PO) Take by mouth.     Cranberry 1000 MG CAPS Take by mouth.     Multiple Vitamins-Minerals (ONE-A-DAY WOMENS PO) Take by mouth.     zinc gluconate 50 MG tablet Take 50 mg by mouth daily.     No current facility-administered medications for this visit.    Family History  Problem Relation Age of Onset   Thyroid disease Mother    Depression Mother    Breast cancer Mother 59   Anxiety disorder Mother    Cancer Father    Lung cancer Father    Thyroid disease Sister    Colon cancer Maternal Aunt        late 70's   Colon cancer Maternal Uncle        late 70's   Breast cancer Cousin        late 41's   Breast cancer Cousin        late 44's   Breast cancer Cousin        late 40's   Colon polyps Neg Hx    Esophageal cancer Neg Hx    Rectal cancer Neg Hx    Stomach cancer Neg Hx   Mom with breast cancer at 43. Maternal 1st cousin with breast cancer in her late  11's Paternal 1st cousin with breast cancer in her late 78's  Another 1st maternal cousin with breast cancer in her late 32's. MUnlce and MAunt with colon cancer, both in their late 70's  Review of Systems: see above  Exam:   BP 110/72   Pulse 82   Ht '5\' 7"'$  (1.702 m)   Wt 127 lb 12.8 oz (58 kg)   LMP  (LMP Unknown)   SpO2 98%   BMI 20.02 kg/m   Weight change: '@WEIGHTCHANGE'$ @ Height:   Height: '5\' 7"'$  (170.2 cm)  Ht Readings from Last 3 Encounters:  12/20/20 '5\' 7"'$  (1.702 m)  04/07/20 '5\' 7"'$  (1.702 m)  03/30/20 5' 7.5" (1.715 m)    General appearance: alert, cooperative and appears stated age Head: Normocephalic, without obvious abnormality, atraumatic Neck: no adenopathy, supple, symmetrical, trachea midline and thyroid normal to inspection and palpation Lungs: clear to auscultation bilaterally Cardiovascular: regular rate and rhythm Breasts: normal appearance, no masses or tenderness Abdomen: soft, non-tender; non distended,  no masses,  no organomegaly Extremities: extremities  normal, atraumatic, no cyanosis or edema Skin: Skin color, texture, turgor normal. No rashes or lesions Lymph nodes: Cervical, supraclavicular, and axillary nodes normal. No abnormal inguinal nodes palpated Neurologic: Grossly normal   Pelvic: External genitalia:  no lesions              Urethra:  normal appearing urethra with no masses, tenderness or lesions              Bartholins and Skenes: normal                 Vagina: mildly atrophic appearing vagina with normal color and discharge, no lesions              Cervix: no lesions               Bimanual Exam:  Uterus:  normal size, contour, position, consistency, mobility, non-tender              Adnexa: no mass, fullness, tenderness               Rectovaginal: Confirms               Anus:  normal sphincter tone, no lesions  Gae Dry chaperoned for the exam.  1. Well woman exam Discussed breast self exam Recommended mammogram, she is doing thermography  Colonoscopy UTD Labs with primary No pap this year Briefly discussed indications and risks or HRT (would need a mammogram)  2. Family history of breast cancer Discussed referral to Genetics  3. Family history of colon cancer Discussed referral to Genetics  4. History of osteopenia Getting calcium, vit d, exercise Getting a copy of her last DEXA to determine when her next DEXA is due

## 2020-12-20 ENCOUNTER — Other Ambulatory Visit: Payer: Self-pay

## 2020-12-20 ENCOUNTER — Encounter: Payer: Self-pay | Admitting: Obstetrics and Gynecology

## 2020-12-20 ENCOUNTER — Ambulatory Visit (INDEPENDENT_AMBULATORY_CARE_PROVIDER_SITE_OTHER): Payer: 59 | Admitting: Obstetrics and Gynecology

## 2020-12-20 VITALS — BP 110/72 | HR 82 | Ht 67.0 in | Wt 127.8 lb

## 2020-12-20 DIAGNOSIS — Z01419 Encounter for gynecological examination (general) (routine) without abnormal findings: Secondary | ICD-10-CM | POA: Diagnosis not present

## 2020-12-20 DIAGNOSIS — Z800 Family history of malignant neoplasm of digestive organs: Secondary | ICD-10-CM

## 2020-12-20 DIAGNOSIS — Z803 Family history of malignant neoplasm of breast: Secondary | ICD-10-CM | POA: Diagnosis not present

## 2020-12-20 DIAGNOSIS — Z8739 Personal history of other diseases of the musculoskeletal system and connective tissue: Secondary | ICD-10-CM | POA: Diagnosis not present

## 2020-12-20 NOTE — Patient Instructions (Signed)

## 2021-04-24 ENCOUNTER — Other Ambulatory Visit: Payer: Self-pay

## 2021-04-24 ENCOUNTER — Ambulatory Visit
Admission: EM | Admit: 2021-04-24 | Discharge: 2021-04-24 | Disposition: A | Discharge status: Home / Self Care | Payer: 59 | Attending: Physician Assistant | Admitting: Physician Assistant

## 2021-04-24 DIAGNOSIS — N390 Urinary tract infection, site not specified: Secondary | ICD-10-CM

## 2021-04-24 LAB — POCT URINALYSIS DIP (MANUAL ENTRY)
Bilirubin, UA: NEGATIVE
Glucose, UA: NEGATIVE mg/dL
Ketones, POC UA: NEGATIVE mg/dL
Nitrite, UA: NEGATIVE
Protein Ur, POC: NEGATIVE mg/dL
Spec Grav, UA: 1.015 (ref 1.010–1.025)
Urobilinogen, UA: 0.2 E.U./dL
pH, UA: 7 (ref 5.0–8.0)

## 2021-04-24 MED ORDER — SULFAMETHOXAZOLE-TRIMETHOPRIM 800-160 MG PO TABS
1.0000 | ORAL_TABLET | Freq: Two times a day (BID) | ORAL | 0 refills | Status: DC
Start: 2021-04-24 — End: 2022-01-02

## 2021-04-24 NOTE — ED Provider Notes (Signed)
[facility]    CSN: [id number] Arrival date & time: 04/24/21  1249      History   Chief Complaint Chief Complaint  Patient presents with   Abdominal Pain   Back Pain   Dysuria   Urinary Frequency    HPI Maria Hayes is a 50 y.o. female.   The history is provided by the patient. No language interpreter was used.  Abdominal Pain Associated symptoms: nausea   Back Pain Dysuria Pain quality:  Aching Pain severity:  Moderate Onset quality:  Gradual Duration:  1 day Timing:  Constant Associated symptoms: nausea   Risk factors: recurrent urinary tract infections    Past Medical History:  Diagnosis Date   Atypical nevus 10/29/2001   CENTER MID BACK( SLIGHT)- NO TX CLEAR   Atypical nevus 06/07/2008   RIGHT THIGH ( MARKED) -TX WIDER SHAVE   Osteopenia    SCCA (squamous cell carcinoma) of skin 07/08/2000   UPPER RIGHT FOREHEAD- TX CURET X 3, 5FU    Patient Active Problem List   Diagnosis Date Noted   Abnormal uterine bleeding 10/29/2019   Bacterial vaginosis 10/29/2019   Breast lump 10/29/2019   Menopausal symptom 10/29/2019   Sensorineural hearing loss (SNHL), bilateral 03/20/2018   Osteopenia     Past Surgical History:  Procedure Laterality Date   BREAST SURGERY     fibroids removed as a teen   DILATION AND CURETTAGE OF UTERUS     EXTERNAL AUDITORY CANAL RECONSTRUCTION Bilateral    WISDOM TOOTH EXTRACTION      OB History     Gravida  0   Para  0   Term  0   Preterm  0   AB  0   Living  0      SAB  0   IAB  0   Ectopic  0   Multiple  0   Live Births  0            Home Medications    Prior to Admission medications   Medication Sig Start Date End Date Taking? Authorizing Provider  sulfamethoxazole-trimethoprim (BACTRIM DS) 800-160 MG tablet Take 1 tablet by mouth 2 (two) times daily. 04/24/21  Yes Caryl Ada K, PA-C  Ascorbic Acid (VITAMIN C PO) Take by mouth.    [provider]  Calcium  Carb-Cholecalciferol (CALCIUM + D3 PO) Take by mouth.    [provider]  Cranberry 1000 MG CAPS Take by mouth.    [provider]  Multiple Vitamins-Minerals (ONE-A-DAY WOMENS PO) Take by mouth.    [provider]  zinc gluconate 50 MG tablet Take 50 mg by mouth daily.    [provider]    Family History Family History  Problem Relation Age of Onset   Thyroid disease Mother    Depression Mother    Breast cancer Mother 69   Anxiety disorder Mother    Cancer Father    Lung cancer Father    Thyroid disease Sister    Colon cancer Maternal Aunt        late 70's   Colon cancer Maternal Uncle        late 70's   Breast cancer Cousin        late 65's   Breast cancer Cousin        late 53's   Breast cancer Cousin        late 40's   Colon polyps Neg Hx    Esophageal cancer  Neg Hx    Rectal cancer Neg Hx    Stomach cancer Neg Hx     Social History Social History   Tobacco Use   Smoking status: Never   Smokeless tobacco: Never  Vaping Use   Vaping Use: Never used  Substance Use Topics   Alcohol use: No   Drug use: No     Allergies   Patient has no known allergies.   Review of Systems Review of Systems  Gastrointestinal:  Positive for nausea.  All other systems reviewed and are negative.   Physical Exam Triage Vital Signs ED Triage Vitals  Enc Vitals Group     BP 04/24/21 1545 (!) 144/82     Pulse Rate 04/24/21 1545 81     Resp 04/24/21 1545 16     Temp 04/24/21 1545 98 F (36.7 C)     Temp Source 04/24/21 1545 Oral     SpO2 04/24/21 1545 99 %     Weight --      Height --      Head Circumference --      Peak Flow --      Pain Score 04/24/21 1546 1     Pain Loc --      Pain Edu? --      Excl. in [place]? --    No data found.  Updated Vital Signs BP (!) 144/82 (BP Location: Right Arm)   Pulse 81   Temp 98 F (36.7 C) (Oral)   Resp 16   LMP  (LMP Unknown)   SpO2 99%   Visual Acuity Right Eye Distance:   Left Eye  Distance:   Bilateral Distance:    Right Eye Near:   Left Eye Near:    Bilateral Near:     Physical Exam Vitals and nursing note reviewed.  Constitutional:      Appearance: She is well-developed.  HENT:     Head: Normocephalic.  Cardiovascular:     Rate and Rhythm: Normal rate.  Pulmonary:     Effort: Pulmonary effort is normal.  Abdominal:     General: Abdomen is flat. There is no distension.     Palpations: Abdomen is soft.  Musculoskeletal:        General: Normal range of motion.     Cervical back: Normal range of motion.  Neurological:     Mental Status: She is alert and oriented to person, place, and time.     UC Treatments / Results  Labs (all labs ordered are listed, but only abnormal results are displayed) Labs Reviewed  POCT URINALYSIS DIP (MANUAL ENTRY) - Abnormal; Notable for the following components:      Result Value   Blood, UA trace-lysed (*)    Leukocytes, UA Small (1+) (*)    All other components within normal limits    EKG   Radiology No results found.  Procedures Procedures (including critical care time)  Medications Ordered in UC Medications - No data to display  Initial Impression / Assessment and Plan / UC Course  I have reviewed the triage vital signs and the nursing notes.  Pertinent labs & imaging results that were available during my care of the patient were reviewed by me and considered in my medical decision making (see chart for details).     Urine culture pending  Final Clinical Impressions(s) / UC Diagnoses   Final diagnoses:  Urinary tract infection without hematuria, site unspecified   Discharge Instructions   None  ED Prescriptions     Medication Sig Dispense Auth. Provider   sulfamethoxazole-trimethoprim (BACTRIM DS) 800-160 MG tablet Take 1 tablet by mouth 2 (two) times daily. 20 tablet Fransico Meadow, [place]      PDMP not reviewed this encounter. An After Visit Summary was printed and given to the  patient.    Fransico Meadow, [place] 04/24/21 1617

## 2021-04-24 NOTE — ED Triage Notes (Signed)
Pt presents with dysuria, urinary urgency/frequency, abd and back pain.

## 2021-05-16 ENCOUNTER — Encounter: Payer: Self-pay | Admitting: Dermatology

## 2021-05-16 ENCOUNTER — Ambulatory Visit (INDEPENDENT_AMBULATORY_CARE_PROVIDER_SITE_OTHER): Payer: 59 | Admitting: Dermatology

## 2021-05-16 ENCOUNTER — Other Ambulatory Visit: Payer: Self-pay

## 2021-05-16 DIAGNOSIS — L738 Other specified follicular disorders: Secondary | ICD-10-CM | POA: Diagnosis not present

## 2021-05-16 DIAGNOSIS — Z1283 Encounter for screening for malignant neoplasm of skin: Secondary | ICD-10-CM | POA: Diagnosis not present

## 2021-05-16 DIAGNOSIS — L821 Other seborrheic keratosis: Secondary | ICD-10-CM | POA: Diagnosis not present

## 2021-05-16 DIAGNOSIS — L820 Inflamed seborrheic keratosis: Secondary | ICD-10-CM

## 2021-05-16 DIAGNOSIS — D239 Other benign neoplasm of skin, unspecified: Secondary | ICD-10-CM

## 2021-05-16 DIAGNOSIS — D230 Other benign neoplasm of skin of lip: Secondary | ICD-10-CM

## 2021-05-16 DIAGNOSIS — L080 Pyoderma: Secondary | ICD-10-CM

## 2021-05-16 DIAGNOSIS — D2371 Other benign neoplasm of skin of right lower limb, including hip: Secondary | ICD-10-CM

## 2021-05-16 DIAGNOSIS — D485 Neoplasm of uncertain behavior of skin: Secondary | ICD-10-CM

## 2021-05-29 ENCOUNTER — Telehealth: Payer: Self-pay | Admitting: Dermatology

## 2021-05-29 NOTE — Telephone Encounter (Signed)
Phone call to patient with her pathology results. Patient aware of results.  

## 2021-05-29 NOTE — Telephone Encounter (Signed)
Patient is calling for pathology results from last visit with Stuart Tafeen, MD 

## 2021-06-13 ENCOUNTER — Encounter: Payer: Self-pay | Admitting: Dermatology

## 2021-06-13 NOTE — Progress Notes (Addendum)
Follow-Up Visit   Subjective  Maria Hayes is a 51 y.o. female who presents for the following: Annual Exam (Patient has a lesion on right ear, x 6 months, bleeding no itching. Lesion on forehead x 3 months, no itching or bleeding. Personal history of scc, atypical moles, and left thigh melanoma. ).  General skin examination, several spots that concern patient Location:  Duration:  Quality:  Associated Signs/Symptoms: Modifying Factors:  Severity:  Timing: Context:   Objective  Well appearing patient in no apparent distress; mood and affect are within normal limits. General skin examination: No atypical pigmented lesions.  1 possible nonmelanoma skin cancer ear will be biopsied today.   Multiple noninflamed tan textured flattopped papules  Right Superior Crus of Antihelix Pearly 4 mm papule with central hemorrhagic crust, rule out carcinoma       Mid Frontal Scalp Small cream-colored papules with a central dell.   Right Lower Leg - Anterior 5 mm pink firm dermal papule   Left Upper Vermilion Lip Nondiagnostic 1 mm pink papule; dermoscopy amorphous.  Left Lower Back, Mid Back, Right Breast (2), Right Flank, Right Upper Back Half dozen inflamed flattopped keratotic papules; patient request that these be treated with freezing and this would be clinically acceptable     A full examination was performed including scalp, head, eyes, ears, nose, lips, neck, chest, axillae, abdomen, back, buttocks, bilateral upper extremities, bilateral lower extremities, hands, feet, fingers, toes, fingernails, and toenails. All findings within normal limits unless otherwise noted below.  Areas beneath undergarments not fully examined.   Assessment & Plan    Neoplasm of uncertain behavior of skin Right Superior Crus of Antihelix  Skin / nail biopsy  Informed consent: discussed and consent obtained   Timeout: patient name, date of birth, surgical site, and procedure verified    Anesthesia: the lesion was anesthetized in a standard fashion   Anesthetic:  1% lidocaine w/ epinephrine 1-100,000 local infiltration Hemostasis achieved with: ferric subsulfate   Outcome: patient tolerated procedure well   Post-procedure details: sterile dressing applied and wound care instructions given   Dressing type: bandage and petrolatum    Skin / nail biopsy  Specimen 1 - Surgical pathology Differential Diagnosis: R/O BCC VS SCC  Check Margins: No  Screening exam for skin cancer  Annual skin examination.  Seborrheic keratosis  Benign, ok to leave: No intervention initiated  Sebaceous hyperplasia of face Mid Frontal Scalp  Benign, ok to leave but told of similar appearance of BCC so if there is growth or bleeding return for biopsy  Dermatofibroma Right Lower Leg - Anterior  Benign, ok to leave unless pt wants removal or clinically changes  Fibrous papule of skin of lip Left Upper Vermilion Lip  Return as needed change  Inflamed seborrheic keratosis (6) Right Breast (2); Right Flank; Right Upper Back; Left Lower Back; Mid Back  Destruction of lesion - Left Lower Back, Mid Back, Right Breast, Right Flank, Right Upper Back Complexity: simple   Destruction method: cryotherapy   Informed consent: discussed and consent obtained   Timeout:  patient name, date of birth, surgical site, and procedure verified Lesion destroyed using liquid nitrogen: Yes   Cryotherapy cycles:  3 Outcome: patient tolerated procedure well with no complications   Post-procedure details: wound care instructions given        I, Lavonna Monarch, MD, have reviewed all documentation for this visit.  The documentation on 07/16/21 for the exam, diagnosis, procedures, and orders are all accurate  and complete.

## 2021-07-16 NOTE — Addendum Note (Signed)
Addended by: Lavonna Monarch on: 07/16/2021 09:11 AM   Modules accepted: Orders

## 2021-07-17 ENCOUNTER — Telehealth: Payer: Self-pay | Admitting: *Deleted

## 2021-07-17 NOTE — Telephone Encounter (Signed)
Patient calling wanting to know why she got new biopsy results in her mychart from 05/16/21. Explained to patient that Dr tafeen signed off on her visit yesterday at 37 :11 according to his note, so Its just sending you notification that was done.

## 2021-12-05 ENCOUNTER — Other Ambulatory Visit: Payer: Self-pay | Admitting: Obstetrics and Gynecology

## 2021-12-05 DIAGNOSIS — Z1231 Encounter for screening mammogram for malignant neoplasm of breast: Secondary | ICD-10-CM

## 2021-12-07 ENCOUNTER — Ambulatory Visit (INDEPENDENT_AMBULATORY_CARE_PROVIDER_SITE_OTHER): Payer: 59

## 2021-12-07 DIAGNOSIS — Z1231 Encounter for screening mammogram for malignant neoplasm of breast: Secondary | ICD-10-CM

## 2021-12-11 ENCOUNTER — Other Ambulatory Visit: Payer: Self-pay | Admitting: Obstetrics and Gynecology

## 2021-12-11 DIAGNOSIS — R928 Other abnormal and inconclusive findings on diagnostic imaging of breast: Secondary | ICD-10-CM

## 2021-12-12 NOTE — Progress Notes (Deleted)
51 y.o. G0P0000 Married White or Caucasian Not Hispanic or Latino female here for annual exam.      No LMP recorded (lmp unknown). Patient is postmenopausal.          Sexually active: {yes no:314532}  The current method of family planning is {contraception:315051}.    Exercising: {yes no:314532}  {types:19826} Smoker:  {YES [id number]  Health Maintenance: Pap:   08/28/2017 WNL NEG HPV, 04/08/14 WNL, NEG HPV History of abnormal Pap:  no MMG:  12/08/21 incomplete *** BMD:    06/03/13 osteopenia  Colonoscopy: 04/07/20 1 polyp was removed, f/u in 7 years TDaP:  utd with primary  Gardasil: n/a   reports that she has never smoked. She has never used smokeless tobacco. She reports that she does not drink alcohol and does not use drugs.  Past Medical History:  Diagnosis Date   Atypical nevus 10/29/2001   CENTER MID BACK( SLIGHT)- NO TX CLEAR   Atypical nevus 06/07/2008   RIGHT THIGH ( MARKED) -TX WIDER SHAVE   Melanoma ([place]) 06/21/2015   mm in situ left upper thigh tx exc   Osteopenia    SCCA (squamous cell carcinoma) of skin 07/08/2000   UPPER RIGHT FOREHEAD- TX CURET X 3, 5FU    Past Surgical History:  Procedure Laterality Date   BREAST SURGERY     fibroids removed as a teen   DILATION AND CURETTAGE OF UTERUS     EXTERNAL AUDITORY CANAL RECONSTRUCTION Bilateral    WISDOM TOOTH EXTRACTION      Current Outpatient Medications  Medication Sig Dispense Refill   Ascorbic Acid (VITAMIN C PO) Take by mouth.     Calcium Carb-Cholecalciferol (CALCIUM + D3 PO) Take by mouth.     Cranberry 1000 MG CAPS Take by mouth.     Multiple Vitamins-Minerals (ONE-A-DAY WOMENS PO) Take by mouth.     sulfamethoxazole-trimethoprim (BACTRIM DS) 800-160 MG tablet Take 1 tablet by mouth 2 (two) times daily. 20 tablet 0   zinc gluconate 50 MG tablet Take 50 mg by mouth daily.     No current facility-administered medications for this visit.    Family History  Problem Relation Age of Onset   Thyroid  disease Mother    Depression Mother    Breast cancer Mother 42   Anxiety disorder Mother    Cancer Father    Lung cancer Father    Thyroid disease Sister    Colon cancer Maternal Aunt        late 70's   Colon cancer Maternal Uncle        late 70's   Breast cancer Cousin        late 53's   Breast cancer Cousin        late 105's   Breast cancer Cousin        late 40's   Colon polyps Neg Hx    Esophageal cancer Neg Hx    Rectal cancer Neg Hx    Stomach cancer Neg Hx     Review of Systems  Exam:   LMP  (LMP Unknown)   Weight change: '@WEIGHTCHANGE'$ @ Height:      Ht Readings from Last 3 Encounters:  12/20/20 '5\' 7"'$  (1.702 m)  04/07/20 '5\' 7"'$  (1.702 m)  03/30/20 5' 7.5" (1.715 m)    General appearance: alert, cooperative and appears stated age Head: Normocephalic, without obvious abnormality, atraumatic Neck: no adenopathy, supple, symmetrical, trachea midline and thyroid {CHL AMB PHY EX THYROID NORM DEFAULT:[phone]::"normal to inspection and  palpation"} Lungs: clear to auscultation bilaterally Cardiovascular: regular rate and rhythm Breasts: {Exam; breast:13139::"normal appearance, no masses or tenderness"} Abdomen: soft, non-tender; non distended,  no masses,  no organomegaly Extremities: extremities normal, atraumatic, no cyanosis or edema Skin: Skin color, texture, turgor normal. No rashes or lesions Lymph nodes: Cervical, supraclavicular, and axillary nodes normal. No abnormal inguinal nodes palpated Neurologic: Grossly normal   Pelvic: External genitalia:  no lesions              Urethra:  normal appearing urethra with no masses, tenderness or lesions              Bartholins and Skenes: normal                 Vagina: normal appearing vagina with normal color and discharge, no lesions              Cervix: {CHL AMB PHY EX CERVIX NORM DEFAULT:[phone]::"no lesions"}               Bimanual Exam:  Uterus:  {CHL AMB PHY EX UTERUS NORM DEFAULT:[phone]::"normal size,  contour, position, consistency, mobility, non-tender"}              Adnexa: {CHL AMB PHY EX ADNEXA NO MASS [phone]::"no mass, fullness, tenderness"}               Rectovaginal: Confirms               Anus:  normal sphincter tone, no lesions  *** chaperoned for the exam.  A:  Well Woman with normal exam  P:

## 2021-12-21 ENCOUNTER — Ambulatory Visit: Payer: 59 | Admitting: Obstetrics and Gynecology

## 2021-12-25 NOTE — Progress Notes (Signed)
51 y.o. G0P0000 Married White or Caucasian Not Hispanic or Latino female here for annual exam.  PMP   Patient states that she has had frequent UTI's. Last UTI was 3 months ago (only one since January). Treated with an e-visit. She doesn't notice a correlation between intercourse and UTI's. She is hydrating, using cranberry pills and apple cider vinegar. She is going on a cruise in a few months and would like antibiotics in case she has a problem   No LMP recorded (lmp unknown). Patient is postmenopausal.          Sexually active: Yes.    The current method of family planning is post menopausal status.    Exercising: Yes.     Cardio or resistance  Smoker:  no  Health Maintenance: Pap:  08/28/2017 WNL NEG HPV, 04/08/14 WNL, NEG HPV History of abnormal Pap:  no MMG:  12/08/21 incomplete She is waiting to return for [place].  BMD:  she reports a h/o osteopenia in 2015 Colonoscopy: 04/07/20 polyp f/u 7 years  TDaP:  utd with primary  Gardasil: n/a   reports that she has never smoked. She has never used smokeless tobacco. She reports that she does not drink alcohol and does not use drugs.  Past Medical History:  Diagnosis Date   Atypical nevus 10/29/2001   CENTER MID BACK( SLIGHT)- NO TX CLEAR   Atypical nevus 06/07/2008   RIGHT THIGH ( MARKED) -TX WIDER SHAVE   Melanoma ([place]) 06/21/2015   mm in situ left upper thigh tx exc   Osteopenia    SCCA (squamous cell carcinoma) of skin 07/08/2000   UPPER RIGHT FOREHEAD- TX CURET X 3, 5FU    Past Surgical History:  Procedure Laterality Date   BREAST SURGERY     fibroids removed as a teen   DILATION AND CURETTAGE OF UTERUS     EXTERNAL AUDITORY CANAL RECONSTRUCTION Bilateral    WISDOM TOOTH EXTRACTION      Current Outpatient Medications  Medication Sig Dispense Refill   Ascorbic Acid (VITAMIN C PO) Take by mouth.     Calcium Carb-Cholecalciferol (CALCIUM + D3 PO) Take by mouth.     Cranberry 1000 MG CAPS Take by mouth.     Multiple  Vitamins-Minerals (ONE-A-DAY WOMENS PO) Take by mouth.     nitrofurantoin, macrocrystal-monohydrate, (MACROBID) 100 MG capsule Take 1 capsule (100 mg total) by mouth 2 (two) times daily. 10 capsule 0   zinc gluconate 50 MG tablet Take 50 mg by mouth daily.     No current facility-administered medications for this visit.    Family History  Problem Relation Age of Onset   Thyroid disease Mother    Depression Mother    Breast cancer Mother 41   Anxiety disorder Mother    Cancer Father    Lung cancer Father    Thyroid disease Sister    Colon cancer Maternal Aunt        late 70's   Colon cancer Maternal Uncle        late 70's   Breast cancer Cousin        late 25's   Breast cancer Cousin        late 41's   Breast cancer Cousin        late 40's   Colon polyps Neg Hx    Esophageal cancer Neg Hx    Rectal cancer Neg Hx    Stomach cancer Neg Hx   Mom with breast cancer at 65. Maternal  1st cousin with breast cancer in her late 28's Paternal 1st cousin with breast cancer in her late 7's  Another 1st maternal cousin with breast cancer in her late 49's. MUnlce and MAunt with colon cancer, both in their late 70's  Review of Systems  All other systems reviewed and are negative.   Exam:   BP 126/72   Pulse 67   Ht $R'5\' 7"'yL$  (1.702 m)   Wt 131 lb (59.4 kg)   LMP  (LMP Unknown)   SpO2 100%   BMI 20.52 kg/m   Weight change: $RemoveBefore'@WEIGHTCHANGE'vHZMSajGIYPnq$ @ Height:   Height: $Remove'5\' 7"'GhHRhnG$  (170.2 cm)  Ht Readings from Last 3 Encounters:  01/02/22 $RemoveB'5\' 7"'ybjJziMz$  (1.702 m)  12/20/20 $RemoveB'5\' 7"'nPKHhsYc$  (1.702 m)  04/07/20 $RemoveB'5\' 7"'eybogWwv$  (1.702 m)    General appearance: alert, cooperative and appears stated age Head: Normocephalic, without obvious abnormality, atraumatic Neck: no adenopathy, supple, symmetrical, trachea midline and thyroid normal to inspection and palpation Lungs: clear to auscultation bilaterally Cardiovascular: regular rate and rhythm Breasts: normal appearance, no masses or tenderness Abdomen: soft, non-tender; non  distended,  no masses,  no organomegaly Extremities: extremities normal, atraumatic, no cyanosis or edema Skin: Skin color, texture, turgor normal. No rashes or lesions Lymph nodes: Cervical, supraclavicular, and axillary nodes normal. No abnormal inguinal nodes palpated Neurologic: Grossly normal   Pelvic: External genitalia:  no lesions              Urethra:  normal appearing urethra with no masses, tenderness or lesions              Bartholins and Skenes: normal                 Vagina: normal appearing vagina with normal color and discharge, no lesions              Cervix: no lesions               Bimanual Exam:  Uterus:  normal size, contour, position, consistency, mobility, non-tender              Adnexa: no mass, fullness, tenderness               Rectovaginal: Confirms               Anus:  normal sphincter tone, no lesions  Marisa Sprinkles, CMA chaperoned for the exam.  1. Well woman exam Discussed breast self exam Discussed calcium and vit D intake Labs with primary Screening mammogram last month, needs diagnostic imaging  2. History of UTI She is traveling and wants antibiotics in case she has problems out of the country - nitrofurantoin, macrocrystal-monohydrate, (MACROBID) 100 MG capsule; Take 1 capsule (100 mg total) by mouth 2 (two) times daily.  Dispense: 10 capsule; Refill: 0  3. History of osteopenia She will get me a copy of her last DEXA (can't read the report in her chart)  4. Family history of breast cancer She will find out about BRCA testing I have recommended f/u with a Dietitian, she will reach out if she wants to do this  5. Family history of colon cancer Colonoscopy is UTD  6. Screening for cervical cancer - Cytology - PAP

## 2022-01-02 ENCOUNTER — Other Ambulatory Visit (HOSPITAL_COMMUNITY)
Admission: RE | Admit: 2022-01-02 | Discharge: 2022-01-02 | Disposition: A | Discharge status: Home / Self Care | Payer: 59 | Source: Ambulatory Visit | Attending: Obstetrics and Gynecology | Admitting: Obstetrics and Gynecology

## 2022-01-02 ENCOUNTER — Encounter: Payer: Self-pay | Admitting: Obstetrics and Gynecology

## 2022-01-02 ENCOUNTER — Ambulatory Visit (INDEPENDENT_AMBULATORY_CARE_PROVIDER_SITE_OTHER): Payer: 59 | Admitting: Obstetrics and Gynecology

## 2022-01-02 VITALS — BP 126/72 | HR 67 | Ht 67.0 in | Wt 131.0 lb

## 2022-01-02 DIAGNOSIS — Z8739 Personal history of other diseases of the musculoskeletal system and connective tissue: Secondary | ICD-10-CM

## 2022-01-02 DIAGNOSIS — Z01419 Encounter for gynecological examination (general) (routine) without abnormal findings: Secondary | ICD-10-CM | POA: Diagnosis not present

## 2022-01-02 DIAGNOSIS — Z803 Family history of malignant neoplasm of breast: Secondary | ICD-10-CM | POA: Diagnosis not present

## 2022-01-02 DIAGNOSIS — Z124 Encounter for screening for malignant neoplasm of cervix: Secondary | ICD-10-CM | POA: Diagnosis present

## 2022-01-02 DIAGNOSIS — Z87440 Personal history of urinary (tract) infections: Secondary | ICD-10-CM

## 2022-01-02 DIAGNOSIS — Z800 Family history of malignant neoplasm of digestive organs: Secondary | ICD-10-CM

## 2022-01-02 MED ORDER — NITROFURANTOIN MONOHYD MACRO 100 MG PO CAPS: 100.0000 mg | ORAL_CAPSULE | Freq: Two times a day (BID) | ORAL | 0 refills | Status: DC

## 2022-01-02 NOTE — Patient Instructions (Signed)

## 2022-01-04 LAB — CYTOLOGY - PAP
Comment: NEGATIVE
Diagnosis: NEGATIVE
High risk HPV: NEGATIVE

## 2022-01-08 ENCOUNTER — Encounter: Payer: Self-pay | Admitting: Obstetrics and Gynecology

## 2022-01-26 DIAGNOSIS — D696 Thrombocytopenia, unspecified: Secondary | ICD-10-CM | POA: Diagnosis not present

## 2022-01-26 DIAGNOSIS — Z79899 Other long term (current) drug therapy: Secondary | ICD-10-CM | POA: Diagnosis not present

## 2022-01-26 DIAGNOSIS — D72819 Decreased white blood cell count, unspecified: Secondary | ICD-10-CM | POA: Diagnosis not present

## 2022-01-26 DIAGNOSIS — Z86006 Personal history of melanoma in-situ: Secondary | ICD-10-CM | POA: Diagnosis not present

## 2022-01-26 DIAGNOSIS — M1990 Unspecified osteoarthritis, unspecified site: Secondary | ICD-10-CM | POA: Diagnosis not present

## 2022-02-06 DIAGNOSIS — D72819 Decreased white blood cell count, unspecified: Secondary | ICD-10-CM | POA: Diagnosis not present

## 2022-02-06 DIAGNOSIS — Z6820 Body mass index (BMI) 20.0-20.9, adult: Secondary | ICD-10-CM | POA: Diagnosis not present

## 2022-02-06 DIAGNOSIS — M1991 Primary osteoarthritis, unspecified site: Secondary | ICD-10-CM | POA: Diagnosis not present

## 2022-02-06 DIAGNOSIS — Z0001 Encounter for general adult medical examination with abnormal findings: Secondary | ICD-10-CM | POA: Diagnosis not present

## 2022-02-06 DIAGNOSIS — D696 Thrombocytopenia, unspecified: Secondary | ICD-10-CM | POA: Diagnosis not present

## 2022-02-09 ENCOUNTER — Ambulatory Visit: Payer: 59

## 2022-02-09 ENCOUNTER — Ambulatory Visit
Admission: RE | Admit: 2022-02-09 | Discharge: 2022-02-09 | Disposition: A | Discharge status: Home / Self Care | Payer: 59 | Source: Ambulatory Visit | Attending: Obstetrics and Gynecology | Admitting: Obstetrics and Gynecology

## 2022-02-09 DIAGNOSIS — N6489 Other specified disorders of breast: Secondary | ICD-10-CM | POA: Diagnosis not present

## 2022-02-09 DIAGNOSIS — R928 Other abnormal and inconclusive findings on diagnostic imaging of breast: Secondary | ICD-10-CM | POA: Diagnosis not present

## 2022-02-23 DIAGNOSIS — M1991 Primary osteoarthritis, unspecified site: Secondary | ICD-10-CM | POA: Diagnosis not present

## 2022-02-23 DIAGNOSIS — Z6820 Body mass index (BMI) 20.0-20.9, adult: Secondary | ICD-10-CM | POA: Diagnosis not present

## 2022-02-23 DIAGNOSIS — R768 Other specified abnormal immunological findings in serum: Secondary | ICD-10-CM | POA: Diagnosis not present

## 2022-05-07 ENCOUNTER — Ambulatory Visit: Payer: 59 | Admitting: Dermatology

## 2023-04-23 ENCOUNTER — Encounter: Payer: Self-pay | Admitting: Obstetrics and Gynecology

## 2023-04-23 ENCOUNTER — Other Ambulatory Visit (HOSPITAL_COMMUNITY)
Admission: RE | Admit: 2023-04-23 | Discharge: 2023-04-23 | Disposition: A | Discharge status: Home / Self Care | Payer: BC Managed Care – PPO | Source: Ambulatory Visit | Attending: Obstetrics and Gynecology | Admitting: Obstetrics and Gynecology

## 2023-04-23 ENCOUNTER — Ambulatory Visit (INDEPENDENT_AMBULATORY_CARE_PROVIDER_SITE_OTHER): Payer: BC Managed Care – PPO | Admitting: Obstetrics and Gynecology

## 2023-04-23 VITALS — BP 118/74 | Resp 16 | Ht 67.25 in | Wt 128.0 lb

## 2023-04-23 DIAGNOSIS — R9230 Dense breasts, unspecified: Secondary | ICD-10-CM | POA: Diagnosis not present

## 2023-04-23 DIAGNOSIS — M8580 Other specified disorders of bone density and structure, unspecified site: Secondary | ICD-10-CM

## 2023-04-23 DIAGNOSIS — Z01419 Encounter for gynecological examination (general) (routine) without abnormal findings: Secondary | ICD-10-CM | POA: Diagnosis present

## 2023-04-23 NOTE — Progress Notes (Signed)
52 y.o. y.o. female here for annual exam. No LMP recorded (lmp unknown). Patient is postmenopausal.    problem   No LMP recorded (lmp unknown). Patient is postmenopausal.          Sexually active: Yes.    The current method of family planning is post menopausal status.    Exercising: Yes.    Cardio or resistance  Smoker:  no  Health Maintenance: Pap:  08/28/2017 WNL NEG HPV, 04/08/14 WNL, NEG HPV History of abnormal Pap:  no MMG:  02/09/22 incomplete had to do diagnostic MRI and with fibrolandular tissue.  Prefers to do MRI of breast in future since they could not do the MMG. BMD:  she reports a h/o osteopenia in 2015.  Repeat ordered Colonoscopy: 04/07/20 polyp f/u 7 years  TDaP:  utd with primary  Gardasil: n/a Body mass index is 19.9 kg/m.     05/08/2016   10:40 AM  Depression screen PHQ 2/9  Decreased Interest 0  Down, Depressed, Hopeless 0  PHQ - 2 Score 0    Height 5' 7.25" (1.708 m), weight 128 lb (58.1 kg).     Component Value Date/Time   DIAGPAP  01/02/2022 1707    - Negative for intraepithelial lesion or malignancy (NILM)   DIAGPAP  08/28/2017 0000    NEGATIVE FOR INTRAEPITHELIAL LESIONS OR MALIGNANCY.   HPVHIGH Negative 01/02/2022 1707   ADEQPAP  01/02/2022 1707    Satisfactory for evaluation; transformation zone component PRESENT.   ADEQPAP  08/28/2017 0000    Satisfactory for evaluation  endocervical/transformation zone component PRESENT.    GYN HISTORY:    Component Value Date/Time   DIAGPAP  01/02/2022 1707    - Negative for intraepithelial lesion or malignancy (NILM)   DIAGPAP  08/28/2017 0000    NEGATIVE FOR INTRAEPITHELIAL LESIONS OR MALIGNANCY.   HPVHIGH Negative 01/02/2022 1707   ADEQPAP  01/02/2022 1707    Satisfactory for evaluation; transformation zone component PRESENT.   ADEQPAP  08/28/2017 0000    Satisfactory for evaluation  endocervical/transformation zone component PRESENT.    OB History  Gravida Para Term Preterm AB  Living  0 0 0 0 0 0  SAB IAB Ectopic Multiple Live Births  0 0 0 0 0    Past Medical History:  Diagnosis Date   Atypical nevus 10/29/2001   CENTER MID BACK( SLIGHT)- NO TX CLEAR   Atypical nevus 06/07/2008   RIGHT THIGH ( MARKED) -TX WIDER SHAVE   Melanoma ([facility]) 06/21/2015   mm in situ left upper thigh tx exc   Osteopenia    SCCA (squamous cell carcinoma) of skin 07/08/2000   UPPER RIGHT FOREHEAD- TX CURET X 3, 5FU    Past Surgical History:  Procedure Laterality Date   BREAST SURGERY     fibroids removed as a teen   DILATION AND CURETTAGE OF UTERUS     EXTERNAL AUDITORY CANAL RECONSTRUCTION Bilateral    WISDOM TOOTH EXTRACTION      Current Outpatient Medications on File Prior to Visit  Medication Sig Dispense Refill   Ascorbic Acid (VITAMIN C PO) Take by mouth.     Calcium Carb-Cholecalciferol (CALCIUM + D3 PO) Take by mouth.     Cranberry 1000 MG CAPS Take by mouth.     GLUCOSAMINE-CHONDROITIN PO Take by mouth.     Multiple Vitamins-Minerals (ONE-A-DAY WOMENS PO) Take by mouth.     zinc gluconate 50 MG tablet Take 50 mg by mouth.     No  current facility-administered medications on file prior to visit.    Social History   Socioeconomic History   Marital status: Married    Spouse name: Not on file   Number of children: Not on file   Years of education: Not on file   Highest education level: Not on file  Occupational History   Not on file  Tobacco Use   Smoking status: Never   Smokeless tobacco: Never  Vaping Use   Vaping status: Never Used  Substance and Sexual Activity   Alcohol use: No   Drug use: No   Sexual activity: Yes    Partners: Male    Birth control/protection: Post-menopausal  Other Topics Concern   Not on file  Social History Narrative   Not on file   Social Determinants of Health   Financial Resource Strain: Not on file  Food Insecurity: Not on file  Transportation Needs: Not on file  Physical Activity: Not on file  Stress: Not on  file  Social Connections: Not on file  Intimate Partner Violence: Not on file    Family History  Problem Relation Age of Onset   Thyroid disease Mother    Depression Mother    Breast cancer Mother 42   Anxiety disorder Mother    Cancer Father    Lung cancer Father    Thyroid disease Sister    Colon cancer Maternal Aunt        late 70's   Colon cancer Maternal Uncle        late 70's   Breast cancer Cousin        late 31's   Breast cancer Cousin        late 7's   Breast cancer Cousin        late 40's   Colon polyps Neg Hx    Esophageal cancer Neg Hx    Rectal cancer Neg Hx    Stomach cancer Neg Hx      No Known Allergies    Patient's last menstrual period was No LMP recorded (lmp unknown). Patient is postmenopausal..           Exercising: yes   Review of Systems Alls systems reviewed and are negative.     Physical Exam Constitutional:      Appearance: Normal appearance.  Genitourinary:     Vulva and urethral meatus normal.     No lesions in the vagina.     Right Labia: No rash, lesions or skin changes.    Left Labia: No lesions, skin changes or rash.    No vaginal discharge or tenderness.     No vaginal prolapse present.    No vaginal atrophy present.     Right Adnexa: not tender, not palpable and no mass present.    Left Adnexa: not tender, not palpable and no mass present.    No cervical motion tenderness or discharge.     Uterus is not enlarged, tender or irregular.  Breasts:    Right: Normal.     Left: Normal.  HENT:     Head: Normocephalic.  Neck:     Thyroid: No thyroid mass, thyromegaly or thyroid tenderness.  Cardiovascular:     Rate and Rhythm: Normal rate and regular rhythm.     Heart sounds: Normal heart sounds, S1 normal and S2 normal.  Pulmonary:     Effort: Pulmonary effort is normal.     Breath sounds: Normal breath sounds and air entry.  Abdominal:  General: There is no distension.     Palpations: Abdomen is soft. There is  no mass.     Tenderness: There is no abdominal tenderness. There is no guarding or rebound.  Musculoskeletal:        General: Normal range of motion.     Cervical back: Full passive range of motion without pain, normal range of motion and neck supple. No tenderness.     Right lower leg: No edema.     Left lower leg: No edema.  Neurological:     Mental Status: She is alert.  Skin:    General: Skin is warm.  Psychiatric:        Mood and Affect: Mood normal.        Behavior: Behavior normal.        Thought Content: Thought content normal.  Vitals and nursing note reviewed. Exam conducted with a chaperone present.       A:         Well Woman GYN exam                             P:        Pap smear collected today Encouraged annual mammogram screening- she prefers the MRI since they had to do this last time with dense tissue Colon cancer screening up-to-date DXA ordered today Labs and immunizations to do with PMD  Encouraged healthy lifestyle practices Encouraged Vit D and Calcium   No follow-ups on file.  Earley Favor

## 2023-04-25 LAB — CYTOLOGY - PAP
Comment: NEGATIVE
Diagnosis: NEGATIVE
High risk HPV: NEGATIVE
